# Patient Record
Sex: Female | Born: 1965
Health system: Southern US, Community
[De-identification: ages and names within clinical notes are randomized; demographics above are authoritative.]

## PROBLEM LIST (undated history)

## (undated) DIAGNOSIS — E079 Disorder of thyroid, unspecified: Secondary | ICD-10-CM

## (undated) DIAGNOSIS — K635 Polyp of colon: Secondary | ICD-10-CM

## (undated) DIAGNOSIS — G43909 Migraine, unspecified, not intractable, without status migrainosus: Secondary | ICD-10-CM

## (undated) DIAGNOSIS — K589 Irritable bowel syndrome without diarrhea: Secondary | ICD-10-CM

## (undated) DIAGNOSIS — E669 Obesity, unspecified: Secondary | ICD-10-CM

## (undated) DIAGNOSIS — I493 Ventricular premature depolarization: Secondary | ICD-10-CM

## (undated) DIAGNOSIS — F32A Depression, unspecified: Secondary | ICD-10-CM

## (undated) DIAGNOSIS — G4730 Sleep apnea, unspecified: Secondary | ICD-10-CM

## (undated) DIAGNOSIS — F419 Anxiety disorder, unspecified: Secondary | ICD-10-CM

## (undated) DIAGNOSIS — K219 Gastro-esophageal reflux disease without esophagitis: Secondary | ICD-10-CM

## (undated) DIAGNOSIS — K5790 Diverticulosis of intestine, part unspecified, without perforation or abscess without bleeding: Secondary | ICD-10-CM

## (undated) DIAGNOSIS — F329 Major depressive disorder, single episode, unspecified: Secondary | ICD-10-CM

## (undated) HISTORY — DX: Diverticulosis of intestine, part unspecified, without perforation or abscess without bleeding: K57.90

## (undated) HISTORY — PX: ESOPHAGOGASTRODUODENOSCOPY: SHX1529

## (undated) HISTORY — DX: Polyp of colon: K63.5

## (undated) HISTORY — DX: Ventricular premature depolarization: I49.3

## (undated) HISTORY — DX: Migraine, unspecified, not intractable, without status migrainosus: G43.909

## (undated) HISTORY — DX: Anxiety disorder, unspecified: F41.9

## (undated) HISTORY — DX: Depression, unspecified: F32.A

## (undated) HISTORY — PX: COLONOSCOPY: SHX174

## (undated) HISTORY — DX: Disorder of thyroid, unspecified: E07.9

## (undated) HISTORY — DX: Gastro-esophageal reflux disease without esophagitis: K21.9

## (undated) HISTORY — DX: Irritable bowel syndrome, unspecified: K58.9

## (undated) HISTORY — DX: Obesity, unspecified: E66.9

## (undated) HISTORY — DX: Sleep apnea, unspecified: G47.30

---
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
## 1996-07-20 DIAGNOSIS — A879 Viral meningitis, unspecified: Secondary | ICD-10-CM

## 1996-07-20 HISTORY — DX: Viral meningitis, unspecified: A87.9

## 1996-07-20 HISTORY — PX: APPENDECTOMY: SHX54

## 1998-07-20 HISTORY — PX: CHOLECYSTECTOMY: SHX55

## 2006-07-20 DIAGNOSIS — A498 Other bacterial infections of unspecified site: Secondary | ICD-10-CM

## 2006-07-20 HISTORY — DX: Other bacterial infections of unspecified site: A49.8

## 2019-03-03 DIAGNOSIS — H5213 Myopia, bilateral: Secondary | ICD-10-CM | POA: Diagnosis not present

## 2019-06-13 DIAGNOSIS — K5900 Constipation, unspecified: Secondary | ICD-10-CM | POA: Diagnosis not present

## 2019-06-13 DIAGNOSIS — G4733 Obstructive sleep apnea (adult) (pediatric): Secondary | ICD-10-CM | POA: Diagnosis not present

## 2019-06-13 DIAGNOSIS — K219 Gastro-esophageal reflux disease without esophagitis: Secondary | ICD-10-CM | POA: Diagnosis not present

## 2019-06-13 DIAGNOSIS — E038 Other specified hypothyroidism: Secondary | ICD-10-CM | POA: Diagnosis not present

## 2019-06-13 DIAGNOSIS — F419 Anxiety disorder, unspecified: Secondary | ICD-10-CM | POA: Diagnosis not present

## 2019-06-13 DIAGNOSIS — R140 Abdominal distension (gaseous): Secondary | ICD-10-CM | POA: Diagnosis not present

## 2019-06-13 DIAGNOSIS — R1310 Dysphagia, unspecified: Secondary | ICD-10-CM | POA: Diagnosis not present

## 2019-06-13 DIAGNOSIS — E559 Vitamin D deficiency, unspecified: Secondary | ICD-10-CM | POA: Diagnosis not present

## 2019-06-13 DIAGNOSIS — R002 Palpitations: Secondary | ICD-10-CM | POA: Diagnosis not present

## 2019-06-14 DIAGNOSIS — R140 Abdominal distension (gaseous): Secondary | ICD-10-CM | POA: Diagnosis not present

## 2019-06-14 DIAGNOSIS — R9431 Abnormal electrocardiogram [ECG] [EKG]: Secondary | ICD-10-CM | POA: Diagnosis not present

## 2019-06-14 DIAGNOSIS — E559 Vitamin D deficiency, unspecified: Secondary | ICD-10-CM | POA: Diagnosis not present

## 2019-06-14 DIAGNOSIS — E038 Other specified hypothyroidism: Secondary | ICD-10-CM | POA: Diagnosis not present

## 2019-07-12 DIAGNOSIS — R1310 Dysphagia, unspecified: Secondary | ICD-10-CM | POA: Diagnosis not present

## 2019-07-12 DIAGNOSIS — Z8619 Personal history of other infectious and parasitic diseases: Secondary | ICD-10-CM | POA: Diagnosis not present

## 2019-07-12 DIAGNOSIS — R140 Abdominal distension (gaseous): Secondary | ICD-10-CM | POA: Diagnosis not present

## 2019-07-12 DIAGNOSIS — K219 Gastro-esophageal reflux disease without esophagitis: Secondary | ICD-10-CM | POA: Diagnosis not present

## 2019-07-12 DIAGNOSIS — K5900 Constipation, unspecified: Secondary | ICD-10-CM | POA: Diagnosis not present

## 2019-07-12 DIAGNOSIS — E669 Obesity, unspecified: Secondary | ICD-10-CM | POA: Diagnosis not present

## 2019-07-17 ENCOUNTER — Encounter: Payer: Self-pay | Admitting: Gastroenterology

## 2019-08-10 DIAGNOSIS — R42 Dizziness and giddiness: Secondary | ICD-10-CM | POA: Diagnosis not present

## 2019-08-10 DIAGNOSIS — E039 Hypothyroidism, unspecified: Secondary | ICD-10-CM | POA: Diagnosis not present

## 2019-08-10 DIAGNOSIS — F419 Anxiety disorder, unspecified: Secondary | ICD-10-CM | POA: Diagnosis not present

## 2019-08-10 DIAGNOSIS — R002 Palpitations: Secondary | ICD-10-CM | POA: Diagnosis not present

## 2019-08-10 DIAGNOSIS — Z8616 Personal history of COVID-19: Secondary | ICD-10-CM | POA: Diagnosis not present

## 2019-08-11 DIAGNOSIS — F419 Anxiety disorder, unspecified: Secondary | ICD-10-CM | POA: Diagnosis not present

## 2019-08-11 DIAGNOSIS — R42 Dizziness and giddiness: Secondary | ICD-10-CM | POA: Diagnosis not present

## 2019-08-11 DIAGNOSIS — E038 Other specified hypothyroidism: Secondary | ICD-10-CM | POA: Diagnosis not present

## 2019-08-11 DIAGNOSIS — H6593 Unspecified nonsuppurative otitis media, bilateral: Secondary | ICD-10-CM | POA: Diagnosis not present

## 2019-08-11 DIAGNOSIS — R002 Palpitations: Secondary | ICD-10-CM | POA: Diagnosis not present

## 2019-08-14 DIAGNOSIS — R7989 Other specified abnormal findings of blood chemistry: Secondary | ICD-10-CM | POA: Diagnosis not present

## 2019-08-18 ENCOUNTER — Ambulatory Visit (INDEPENDENT_AMBULATORY_CARE_PROVIDER_SITE_OTHER): Payer: 59 | Admitting: Gastroenterology

## 2019-08-18 ENCOUNTER — Encounter: Payer: Self-pay | Admitting: Gastroenterology

## 2019-08-18 VITALS — BP 120/64 | HR 89 | Temp 98.6°F | Ht 63.0 in | Wt 192.0 lb

## 2019-08-18 DIAGNOSIS — R1310 Dysphagia, unspecified: Secondary | ICD-10-CM | POA: Diagnosis not present

## 2019-08-18 DIAGNOSIS — K581 Irritable bowel syndrome with constipation: Secondary | ICD-10-CM

## 2019-08-18 MED ORDER — LINACLOTIDE 72 MCG PO CAPS: 72.0000 ug | ORAL_CAPSULE | Freq: Every day | ORAL | 11 refills | Status: DC

## 2019-08-18 NOTE — Progress Notes (Signed)
HPI: This is a very pleasant 54 year old woman who was referred by Dr. Kirby Crigler  Chief complaint is constipation dominant IBS, dysphagia, personal history of colon polyps  ID-first time today.  She has a long history of irritable bowel like issues.  She is tends to be chronically constipated.  She knows that stress plays a big role in her symptoms.  She tries to keep up on fiber in her diet, exercise and water.  She says Sheryle Spray sometimes helps as well.  She will periodically take MiraLAX.  She has never tried Linzess or Amitiza.  She does not see blood in her stool.  She usually will have a bowel movement about every day.  She has chronic intermittent solid food dysphagia.  This is no worse than it has been for years.  Breads and potatoes sometimes cause problems.  Also rice can make her cough.  Her weight is overall stable.  She does not have chronic heartburn   Old Data Reviewed:  She brought with her a 23 page packet of information dating back at least 10 years from her care at a [place] gastro[facility] office.  Gastric emptying scan December 2011 was normal   Pathology results from colonoscopy and upper endoscopy October 2018: Polyps from her colon were all hyperplastic.  GE junction showed GERD damage.  No eosinophilic esophagitis.  The duodenum was normal.  The stomach showed mild chronic gastritis without H. Pylori.  EGD report October 2018 indications GERD and dysphagia.  Findings "benign intrinsic 13 mm stricture was seen at the GE junction" it was dilated to 15 mm with a balloon.  Esophagitis was seen as well.  Biopsies were taken from a normal esophagus.  Colonoscopy report October 2018 described small 4 mm polyp in the sigmoid colon, diverticulosis in the left colon.  The examination was otherwise normal.  Blood work December 2010 celiac sprue panel was all negative including TTG and total IgA  EGD report November 2011 done for dysphagia and GERD described normal duodenum,  hiatal hernia, erythema in the lower esophagus.  Otherwise normal examination.  Biopsies were taken from her stomach I believe.  Sits marker testing November 2011 showed "no remaining sits markers"  CT scan chest abdomen pelvis with IV contrast done for "dysphagia and right upper quadrant pain" showed status post cholecystectomy, status post appendectomy, the examination was otherwise normal  Blood work November 2020 showed completely normal complete metabolic profile, normal CBC normal hemoglobin A1c  Esophageal manometry report November 2011 was "normal"  Ambulatory pH monitoring test November 2011 showed "poor gastric acid suppression" also "positive symptom association with regurgitation of throat pain" this was done while on medicines at [facility] 40 mg once daily   Stool Hemoccult testing December 2011 was negative  There is an office note from believe her gastroenterologist dated April 2018 in this they alluded to a "recent upper GI report where a Schatzki's ring in her distal esophagus was described"      Review of systems: Pertinent positive and negative review of systems were noted in the above HPI section. All other review negative.   Past Medical History:  Diagnosis Date  . Anxiety   . Clostridium difficile infection 2008  . Colon polyps   . Depression   . Diverticulosis   . GERD (gastroesophageal reflux disease)   . Irritable bowel syndrome (IBS)   . Migraines   . Obesity   . PVC (premature ventricular contraction)    when stressed  . Sleep apnea  mild   . Thyroid disease    2010 nodules only    Past Surgical History:  Procedure Laterality Date  . APPENDECTOMY  1998  . CHOLECYSTECTOMY  2000  . COLONOSCOPY    . ESOPHAGOGASTRODUODENOSCOPY      Current Outpatient Medications  Medication Sig Dispense Refill  . cetirizine (ZYRTEC) 10 MG tablet Take 10 mg by mouth daily.    . Cholecalciferol (VITAMIN D3) 125 MCG (5000 UT) CAPS Take 1 capsule by mouth daily.     . famotidine (PEPCID) 20 MG tablet Take 20 mg by mouth 2 (two) times daily.    . fluticasone (FLONASE) 50 MCG/ACT nasal spray Place 1 spray into both nostrils 2 (two) times daily.    Marland Kitchen levothyroxine (SYNTHROID) 75 MCG tablet Take 75 mcg by mouth daily before breakfast.    . Probiotic Product (PROBIOTIC PO) Take by mouth. It is in her fiber pill     No current facility-administered medications for this visit.    Allergies as of 08/18/2019 - Review Complete 08/18/2019  Allergen Reaction Noted  . Ansaid [flurbiprofen]  08/18/2019  . Celebrex [celecoxib] Other (See Comments) 08/18/2019  . Ibuprofen  08/18/2019    Family History  Problem Relation Age of Onset  . Lung cancer Mother   . Colonic polyp Mother   . Lung cancer Father   . Breast cancer Sister   . Colon cancer Neg Hx   . Esophageal cancer Neg Hx   . Rectal cancer Neg Hx     Social History   Socioeconomic History  . Marital status: Divorced    Spouse name: Not on file  . Number of children: 0  . Years of education: Not on file  . Highest education level: Not on file  Occupational History  . Occupation: Research scientist (medical)  Tobacco Use  . Smoking status: Never Smoker  . Smokeless tobacco: Never Used  Substance and Sexual Activity  . Alcohol use: Yes    Comment: rare  . Drug use: Never  . Sexual activity: Not on file  Other Topics Concern  . Not on file  Social History Narrative  . Not on file   Social Determinants of Health   Financial Resource Strain:   . Difficulty of Paying Living Expenses: Not on file  Food Insecurity:   . Worried About Charity fundraiser in the Last Year: Not on file  . Ran Out of Food in the Last Year: Not on file  Transportation Needs:   . Lack of Transportation (Medical): Not on file  . Lack of Transportation (Non-Medical): Not on file  Physical Activity:   . Days of Exercise per Week: Not on file  . Minutes of Exercise per Session: Not on file  Stress:   . Feeling of Stress :  Not on file  Social Connections:   . Frequency of Communication with Friends and Family: Not on file  . Frequency of Social Gatherings with Friends and Family: Not on file  . Attends Religious Services: Not on file  . Active Member of Clubs or Organizations: Not on file  . Attends Archivist Meetings: Not on file  . Marital Status: Not on file  Intimate Partner Violence:   . Fear of Current or Ex-Partner: Not on file  . Emotionally Abused: Not on file  . Physically Abused: Not on file  . Sexually Abused: Not on file     Physical Exam: BP 120/64   Pulse 89   Temp  98.6 F (37 C)   Ht 5\' 3"  (1.6 m)   Wt 192 lb (87.1 kg)   BMI 34.01 kg/m  Constitutional: generally well-appearing Psychiatric: alert and oriented x3 Eyes: extraocular movements intact Mouth: oral pharynx moist, no lesions Neck: supple no lymphadenopathy Cardiovascular: heart regular rate and rhythm Lungs: clear to auscultation bilaterally Abdomen: soft, nontender, nondistended, no obvious ascites, no peritoneal signs, normal bowel sounds Extremities: no lower extremity edema bilaterally Skin: no lesions on visible extremities   Assessment and plan: 54 y.o. female with IBS, constipation predominance, personal history of polyps, chronic dysphagia  First we discussed other options for her chronic IBS.  She would like to try Linzess and I am happy to call in a prescription for the low-dose 72 mcg pill which she will try 1 pill once daily.  Her mild intermittent chronic dysphagia is no worse lately than it has been for years.  She has not really very bothered by it.  She knows to call if it has significantly worsened.  She will continue chewing her food slowly, taking small bites and eating slowly.  The polyps removed from her 2018 colonoscopy were not precancerous.  She understands that she does not need repeat colonoscopy for 10 years from then I will put her in our recall system for her.    Please see the  "Patient Instructions" section for addition details about the plan.   Owens Loffler, MD [facility] 08/18/2019, 10:14 AM  Cc: No ref. provider found  Total time on date of encounter was 45  minutes (this included time spent preparing to see the patient reviewing records; obtaining and/or reviewing separately obtained history; performing a medically appropriate exam and/or evaluation; counseling and educating the patient and family if present; ordering medications, tests or procedures if applicable; and documenting clinical information in the health record).

## 2019-08-18 NOTE — Patient Instructions (Addendum)
You will be due for a recall colonoscopy in 04/2027. We will send you a reminder in the mail when it gets closer to that time.  We have sent the following medications to your pharmacy for you to pick up at your convenience: Linzess 72 mcg daily   Call if you have worsening trouble with swallowing. Ask for Koren Shiver, RN, BSN.

## 2019-09-04 DIAGNOSIS — L821 Other seborrheic keratosis: Secondary | ICD-10-CM | POA: Diagnosis not present

## 2019-09-04 DIAGNOSIS — D485 Neoplasm of uncertain behavior of skin: Secondary | ICD-10-CM | POA: Diagnosis not present

## 2019-09-04 DIAGNOSIS — L218 Other seborrheic dermatitis: Secondary | ICD-10-CM | POA: Diagnosis not present

## 2019-09-04 DIAGNOSIS — L814 Other melanin hyperpigmentation: Secondary | ICD-10-CM | POA: Diagnosis not present

## 2019-11-09 DIAGNOSIS — K5900 Constipation, unspecified: Secondary | ICD-10-CM | POA: Diagnosis not present

## 2019-11-09 DIAGNOSIS — R002 Palpitations: Secondary | ICD-10-CM | POA: Diagnosis not present

## 2019-11-09 DIAGNOSIS — F419 Anxiety disorder, unspecified: Secondary | ICD-10-CM | POA: Diagnosis not present

## 2019-11-09 DIAGNOSIS — Z8616 Personal history of COVID-19: Secondary | ICD-10-CM | POA: Diagnosis not present

## 2020-01-10 ENCOUNTER — Other Ambulatory Visit (HOSPITAL_BASED_OUTPATIENT_CLINIC_OR_DEPARTMENT_OTHER): Payer: Self-pay | Admitting: Internal Medicine

## 2020-01-18 DIAGNOSIS — Z01419 Encounter for gynecological examination (general) (routine) without abnormal findings: Secondary | ICD-10-CM | POA: Diagnosis not present

## 2020-01-18 DIAGNOSIS — Z6833 Body mass index (BMI) 33.0-33.9, adult: Secondary | ICD-10-CM | POA: Diagnosis not present

## 2020-01-30 DIAGNOSIS — Z1231 Encounter for screening mammogram for malignant neoplasm of breast: Secondary | ICD-10-CM | POA: Diagnosis not present

## 2020-02-08 DIAGNOSIS — H15102 Unspecified episcleritis, left eye: Secondary | ICD-10-CM | POA: Diagnosis not present

## 2020-02-16 ENCOUNTER — Other Ambulatory Visit (HOSPITAL_COMMUNITY): Payer: Self-pay | Admitting: Obstetrics and Gynecology

## 2020-02-16 ENCOUNTER — Other Ambulatory Visit: Payer: Self-pay | Admitting: Obstetrics and Gynecology

## 2020-02-16 DIAGNOSIS — Z803 Family history of malignant neoplasm of breast: Secondary | ICD-10-CM

## 2020-02-27 ENCOUNTER — Ambulatory Visit (HOSPITAL_COMMUNITY)
Admission: RE | Admit: 2020-02-27 | Discharge: 2020-02-27 | Disposition: A | Discharge status: Home / Self Care | Payer: 59 | Source: Ambulatory Visit | Attending: Obstetrics and Gynecology | Admitting: Obstetrics and Gynecology

## 2020-02-27 ENCOUNTER — Other Ambulatory Visit: Payer: Self-pay

## 2020-02-27 DIAGNOSIS — Z803 Family history of malignant neoplasm of breast: Secondary | ICD-10-CM | POA: Diagnosis not present

## 2020-02-27 DIAGNOSIS — Z1239 Encounter for other screening for malignant neoplasm of breast: Secondary | ICD-10-CM | POA: Insufficient documentation

## 2020-02-27 DIAGNOSIS — N6489 Other specified disorders of breast: Secondary | ICD-10-CM | POA: Diagnosis not present

## 2020-02-27 MED ORDER — GADOBUTROL 1 MMOL/ML IV SOLN
8.0000 mL | Freq: Once | INTRAVENOUS | Status: AC | PRN
  Administered 2020-02-27: 8 mL via INTRAVENOUS

## 2020-03-18 DIAGNOSIS — H5213 Myopia, bilateral: Secondary | ICD-10-CM | POA: Diagnosis not present

## 2020-04-22 MED FILL — SERTRALINE HCL 50 MG TABLET: 50 | 30 days supply | Qty: 30 | Fill #0

## 2020-05-30 MED FILL — SERTRALINE HCL 50 MG TABLET: 50 | 30 days supply | Qty: 30 | Fill #1

## 2020-05-31 ENCOUNTER — Other Ambulatory Visit (HOSPITAL_BASED_OUTPATIENT_CLINIC_OR_DEPARTMENT_OTHER): Payer: Self-pay | Admitting: Internal Medicine

## 2020-05-31 MED FILL — LEVOTHYROXINE SODIUM 75 MCG: 75 | 90 days supply | Qty: 90 | Fill #0

## 2020-07-01 MED FILL — SERTRALINE HCL 50 MG TABLET: 50 | 30 days supply | Qty: 30 | Fill #2

## 2020-07-31 MED FILL — SERTRALINE HCL 50 MG TABLET: 50 | 30 days supply | Qty: 30 | Fill #3

## 2020-08-30 MED FILL — SERTRALINE HCL 50 MG TABLET: 50 | 30 days supply | Qty: 30 | Fill #4

## 2020-08-30 MED FILL — LEVOTHYROXINE SODIUM 75 MCG: 75 | 90 days supply | Qty: 90 | Fill #1

## 2020-09-03 DIAGNOSIS — L821 Other seborrheic keratosis: Secondary | ICD-10-CM | POA: Diagnosis not present

## 2020-09-03 DIAGNOSIS — I8392 Asymptomatic varicose veins of left lower extremity: Secondary | ICD-10-CM | POA: Diagnosis not present

## 2020-09-03 DIAGNOSIS — L814 Other melanin hyperpigmentation: Secondary | ICD-10-CM | POA: Diagnosis not present

## 2020-09-10 DIAGNOSIS — I83813 Varicose veins of bilateral lower extremities with pain: Secondary | ICD-10-CM | POA: Diagnosis not present

## 2020-09-24 DIAGNOSIS — I83813 Varicose veins of bilateral lower extremities with pain: Secondary | ICD-10-CM | POA: Diagnosis not present

## 2020-10-03 DIAGNOSIS — I83813 Varicose veins of bilateral lower extremities with pain: Secondary | ICD-10-CM | POA: Diagnosis not present

## 2020-10-28 ENCOUNTER — Other Ambulatory Visit (HOSPITAL_BASED_OUTPATIENT_CLINIC_OR_DEPARTMENT_OTHER): Payer: Self-pay

## 2020-10-28 ENCOUNTER — Other Ambulatory Visit (HOSPITAL_BASED_OUTPATIENT_CLINIC_OR_DEPARTMENT_OTHER): Payer: Self-pay | Admitting: Internal Medicine

## 2020-10-29 MED ORDER — SERTRALINE HCL 50 MG PO TABS
ORAL_TABLET | ORAL | 0 refills | Status: AC
  Filled 2020-10-29: qty 90, 90d supply, fill #0
  Filled 2021-02-06: qty 83, 83d supply, fill #1

## 2020-10-30 ENCOUNTER — Other Ambulatory Visit (HOSPITAL_BASED_OUTPATIENT_CLINIC_OR_DEPARTMENT_OTHER): Payer: Self-pay

## 2020-12-04 ENCOUNTER — Other Ambulatory Visit (HOSPITAL_BASED_OUTPATIENT_CLINIC_OR_DEPARTMENT_OTHER): Payer: Self-pay

## 2020-12-04 MED FILL — Levothyroxine Sodium Tab 75 MCG: ORAL | 90 days supply | Qty: 90 | Fill #0 | Status: AC

## 2020-12-13 DIAGNOSIS — H532 Diplopia: Secondary | ICD-10-CM | POA: Diagnosis not present

## 2020-12-17 DIAGNOSIS — E039 Hypothyroidism, unspecified: Secondary | ICD-10-CM | POA: Diagnosis not present

## 2020-12-17 DIAGNOSIS — G4733 Obstructive sleep apnea (adult) (pediatric): Secondary | ICD-10-CM | POA: Diagnosis not present

## 2020-12-17 DIAGNOSIS — E559 Vitamin D deficiency, unspecified: Secondary | ICD-10-CM | POA: Diagnosis not present

## 2020-12-17 DIAGNOSIS — H532 Diplopia: Secondary | ICD-10-CM | POA: Diagnosis not present

## 2020-12-18 ENCOUNTER — Other Ambulatory Visit: Payer: Self-pay

## 2020-12-18 ENCOUNTER — Ambulatory Visit (HOSPITAL_COMMUNITY)
Admission: RE | Admit: 2020-12-18 | Discharge: 2020-12-18 | Disposition: A | Discharge status: Home / Self Care | Payer: 59 | Source: Ambulatory Visit | Attending: Internal Medicine | Admitting: Internal Medicine

## 2020-12-18 ENCOUNTER — Other Ambulatory Visit (HOSPITAL_COMMUNITY): Payer: Self-pay | Admitting: Internal Medicine

## 2020-12-18 ENCOUNTER — Other Ambulatory Visit: Payer: Self-pay | Admitting: Internal Medicine

## 2020-12-18 DIAGNOSIS — H538 Other visual disturbances: Secondary | ICD-10-CM | POA: Diagnosis not present

## 2020-12-18 DIAGNOSIS — H532 Diplopia: Secondary | ICD-10-CM | POA: Diagnosis not present

## 2020-12-18 MED ORDER — GADOBUTROL 1 MMOL/ML IV SOLN
10.0000 mL | Freq: Once | INTRAVENOUS | Status: AC | PRN
  Administered 2020-12-18: 10 mL via INTRAVENOUS

## 2020-12-20 DIAGNOSIS — H532 Diplopia: Secondary | ICD-10-CM | POA: Diagnosis not present

## 2021-01-08 ENCOUNTER — Ambulatory Visit (INDEPENDENT_AMBULATORY_CARE_PROVIDER_SITE_OTHER): Payer: 59 | Admitting: Neurology

## 2021-01-08 ENCOUNTER — Encounter: Payer: Self-pay | Admitting: Neurology

## 2021-01-08 VITALS — BP 141/85 | HR 78 | Ht 63.0 in | Wt 204.0 lb

## 2021-01-08 DIAGNOSIS — H532 Diplopia: Secondary | ICD-10-CM | POA: Diagnosis not present

## 2021-01-08 NOTE — Progress Notes (Signed)
Reason for visit: Double vision  Referring physician: Dr. Ernesto Rutherford Brittany Padilla is a 55 y.o. female  History of present illness:  Brittany Padilla is a 55 year old right-handed white female with a history of migraine headache.  She also has a history of sleep apnea on CPAP and hypothyroidism as well as obesity.  The patient reported a spontaneous onset of intermittent double vision that began in late January 2022.  The patient noted the double vision while driving, she would note that the lines may converge on the road and she may see more lanes on the road than are actually present.  The patient has had some worsening of the double vision of the last 3 weeks or so, she went to her eye doctor and had prisms placed on her glasses which seem to help.  The patient reports that she may increase the double vision by looking to the far right or to the far left.  The patient reports some chronic issues with feeling as if things are getting stuck in her throat when swallowing, she denies problems chewing.  She denies any weakness of the extremities or numbness of the extremities.  She has slight gait instability but no falls.  She denies issues controlling the bowels or the bladder.  She does have a history of migraine headaches are associated with visual aura.  The patient will get a sensation that things are too bright and then see jagged lines and have scotoma in the vision lasting about 30 minutes and then she will get a headache that would last up to 3 days.  She may have other more minor headaches.  Her usual headaches are in the frontal area.  Eating eggs too much, processed meats, possibly cheese, bright lights, and possibly weather changes may bring on headache.  Due to the double vision issue, she is sent here for further evaluation.  She denies any history of strabismus, she has not had any droopiness of the eyelids on either side.  It is possible that the double vision may become more prominent when  she is tired, if she covers 1 eye of the double vision goes away.  Past Medical History:  Diagnosis Date   Anxiety    Clostridium difficile infection 2008   Colon polyps    Depression    Diverticulosis    GERD (gastroesophageal reflux disease)    Irritable bowel syndrome (IBS)    Migraines    Obesity    PVC (premature ventricular contraction)    when stressed   Sleep apnea    mild    Thyroid disease    2010 nodules only   Viral meningitis 1998    Past Surgical History:  Procedure Laterality Date   APPENDECTOMY  1998   CHOLECYSTECTOMY  2000   COLONOSCOPY     ESOPHAGOGASTRODUODENOSCOPY      Family History  Problem Relation Age of Onset   Lung cancer Mother    Colonic polyp Mother    Lung cancer Father    Breast cancer Sister    Colon cancer Neg Hx    Esophageal cancer Neg Hx    Rectal cancer Neg Hx     Social history:  reports that she has never smoked. She has never used smokeless tobacco. She reports current alcohol use. She reports that she does not use drugs.  Medications:  Prior to Admission medications   Medication Sig Start Date End Date Taking? Authorizing Provider  cetirizine (ZYRTEC) 10 MG  tablet Take 10 mg by mouth daily.    [provider]  Cholecalciferol (VITAMIN D3) 125 MCG (5000 UT) CAPS Take 1 capsule by mouth daily.    [provider]  famotidine (PEPCID) 20 MG tablet Take 20 mg by mouth 2 (two) times daily.    [provider]  fluticasone (FLONASE) 50 MCG/ACT nasal spray Place 1 spray into both nostrils 2 (two) times daily.    [provider]  levothyroxine (SYNTHROID) 75 MCG tablet Take 75 mcg by mouth daily before breakfast.    [provider]  levothyroxine (SYNTHROID) 75 MCG tablet TAKE 1 TABLET BY MOUTH EVERY MORNING ON AN EMPTY STOMACH 05/31/20 05/31/21  Sueanne Margarita, DO  linaclotide [facility]) 72 MCG capsule Take 1 capsule (72 mcg total) by mouth daily before breakfast. 08/18/19   Milus Banister, MD  Probiotic Product (PROBIOTIC PO) Take by mouth. It is in her fiber pill    [provider]  sertraline (ZOLOFT) 50 MG tablet TAKE 1 TABLET BY MOUTH ONCE DAILY IF NOT TOLERATED CAN TAKE 1/2 TABLET FOR 2 WEEKS 10/29/20 10/29/21  Sueanne Margarita, DO      Allergies  Allergen Reactions   Ansaid [Flurbiprofen]     Prefers not too   Celebrex [Celecoxib] Other (See Comments)    Red itchy hands and feet and roof of mouth itched   Ibuprofen     ROS:  Out of a complete 14 system review of symptoms, the patient complains only of the following symptoms, and all other reviewed systems are negative.  Double vision Headache  Blood pressure (!) 141/85, pulse 78, height 5\' 3"  (1.6 m), weight 204 lb (92.5 kg).  Physical Exam  General: The patient is alert and cooperative at the time of the examination.  The patient is moderately obese.  Eyes: Pupils are equal, round, and reactive to light. Discs are flat bilaterally.  Neck: The neck is supple, no carotid bruits are noted.  Respiratory: The respiratory examination is clear.  Cardiovascular: The cardiovascular examination reveals a regular rate and rhythm, no obvious murmurs or rubs are noted.  Skin: Extremities are without significant edema.  Neurologic Exam  Mental status: The patient is alert and oriented x 3 at the time of the examination. The patient has apparent normal recent and remote memory, with an apparently normal attention span and concentration ability.  Cranial nerves: Facial symmetry is present. There is good sensation of the face to pinprick and soft touch bilaterally. The strength of the facial muscles and the muscles to head turning and shoulder shrug are normal bilaterally. Speech is well enunciated, no aphasia or dysarthria is noted. Extraocular movements are full. Visual fields are full. The tongue is midline, and the patient has symmetric elevation of the soft palate. No obvious hearing deficits are noted.   Cover test is positive in the horizontal plane.  Motor: The motor testing reveals 5 over 5 strength of all 4 extremities. Good symmetric motor tone is noted throughout.  Sensory: Sensory testing is intact to pinprick, soft touch, vibration sensation, and position sense on all 4 extremities. No evidence of extinction is noted.  Coordination: Cerebellar testing reveals good finger-nose-finger and heel-to-shin bilaterally.  Gait and station: Gait is normal. Tandem gait is normal. Romberg is negative. No drift is seen.  Reflexes: Deep tendon reflexes are symmetric and normal bilaterally. Toes are downgoing bilaterally.   MRI brain 12/18/20:  IMPRESSION: 1. No acute intracranial abnormality. 2. Multifocal white matter hyperintensities, most consistent  with chronic microvascular ischemia.   * MRI scan images were reviewed online. I agree with the written report.   MRA head 12/18/20:  IMPRESSION: Normal intracranial MRA.    Assessment/Plan:  1.  Intermittent double vision, horizontal  The patient has had some intermittent double vision over the last 6 months or so.  The problem has worsened slightly over the last 3 weeks, but she has gotten benefit from a prism that she is wearing.  We will perform a bit further work-up with blood work, we may consider an empiric trial on Mestinon.  The patient very well could have a latent strabismus as a source of her double vision.  MRI done does show some mild white matter changes, the patient brought in MRI of the brain that was done in 2011, there appears to be very little change on the most recent study.  She will follow-up in 3 to 4 months.  Jill Alexanders MD 01/08/2021 11:44 AM  [facility] [address] [place], [place] [address]  Phone [phone] Fax [phone]

## 2021-01-09 LAB — LYME DISEASE SEROLOGY W/REFLEX: Lyme Total Antibody EIA: NEGATIVE

## 2021-01-09 LAB — THYROGLOBULIN ANTIBODY: Thyroglobulin Antibody: <1 IU/mL (ref 0.0–0.9)

## 2021-01-09 LAB — SEDIMENTATION RATE: Sed Rate: 24 mm/hr (ref 0–40)

## 2021-01-09 LAB — HEMOGLOBIN A1C
Est. average glucose Bld gHb Est-mCnc: 114 mg/dL
Hgb A1c MFr Bld: 5.6 % (ref 4.8–5.6)

## 2021-01-09 LAB — ANA W/REFLEX: Anti Nuclear Antibody (ANA): NEGATIVE

## 2021-01-09 LAB — THYROID PEROXIDASE ANTIBODY: Thyroperoxidase Ab SerPl-aCnc: <8 IU/mL (ref 0–34)

## 2021-01-09 LAB — VITAMIN B12: Vitamin B-12: 655 pg/mL (ref 232–1245)

## 2021-01-09 LAB — ANGIOTENSIN CONVERTING ENZYME: Angio Convert Enzyme: 53 U/L (ref 14–82)

## 2021-01-09 LAB — ACETYLCHOLINE RECEPTOR, BINDING: AChR Binding Ab, Serum: 0.04 nmol/L (ref 0.00–0.24)

## 2021-01-10 ENCOUNTER — Telehealth: Payer: Self-pay | Admitting: Neurology

## 2021-01-10 DIAGNOSIS — H532 Diplopia: Secondary | ICD-10-CM | POA: Diagnosis not present

## 2021-01-10 MED ORDER — PYRIDOSTIGMINE BROMIDE 60 MG PO TABS: 30.0000 mg | ORAL_TABLET | Freq: Three times a day (TID) | ORAL | 2 refills | Status: DC

## 2021-01-10 NOTE — Addendum Note (Signed)
Addended by: Kathrynn Ducking on: 01/10/2021 11:42 AM   Modules accepted: Orders

## 2021-01-10 NOTE — Telephone Encounter (Signed)
I called the patient.  We will give a trial on Mestinon, if this does benefit her vision, she is to contact our office.

## 2021-01-10 NOTE — Telephone Encounter (Signed)
Pt is asking about when she will get the trial Mestinon, please call.

## 2021-02-03 DIAGNOSIS — Z01419 Encounter for gynecological examination (general) (routine) without abnormal findings: Secondary | ICD-10-CM | POA: Diagnosis not present

## 2021-02-03 DIAGNOSIS — Z6835 Body mass index (BMI) 35.0-35.9, adult: Secondary | ICD-10-CM | POA: Diagnosis not present

## 2021-02-03 DIAGNOSIS — Z1231 Encounter for screening mammogram for malignant neoplasm of breast: Secondary | ICD-10-CM | POA: Diagnosis not present

## 2021-02-06 ENCOUNTER — Other Ambulatory Visit (HOSPITAL_BASED_OUTPATIENT_CLINIC_OR_DEPARTMENT_OTHER): Payer: Self-pay

## 2021-02-14 DIAGNOSIS — Z6835 Body mass index (BMI) 35.0-35.9, adult: Secondary | ICD-10-CM | POA: Diagnosis not present

## 2021-02-14 DIAGNOSIS — H532 Diplopia: Secondary | ICD-10-CM | POA: Diagnosis not present

## 2021-02-14 DIAGNOSIS — Z1339 Encounter for screening examination for other mental health and behavioral disorders: Secondary | ICD-10-CM | POA: Diagnosis not present

## 2021-02-14 DIAGNOSIS — Z1331 Encounter for screening for depression: Secondary | ICD-10-CM | POA: Diagnosis not present

## 2021-02-14 DIAGNOSIS — E559 Vitamin D deficiency, unspecified: Secondary | ICD-10-CM | POA: Diagnosis not present

## 2021-02-14 DIAGNOSIS — F325 Major depressive disorder, single episode, in full remission: Secondary | ICD-10-CM | POA: Diagnosis not present

## 2021-02-14 DIAGNOSIS — E039 Hypothyroidism, unspecified: Secondary | ICD-10-CM | POA: Diagnosis not present

## 2021-02-14 DIAGNOSIS — Z23 Encounter for immunization: Secondary | ICD-10-CM | POA: Diagnosis not present

## 2021-02-14 DIAGNOSIS — Z0000 Encounter for general adult medical examination without abnormal findings: Secondary | ICD-10-CM | POA: Diagnosis not present

## 2021-02-14 DIAGNOSIS — J351 Hypertrophy of tonsils: Secondary | ICD-10-CM | POA: Diagnosis not present

## 2021-02-14 DIAGNOSIS — G4733 Obstructive sleep apnea (adult) (pediatric): Secondary | ICD-10-CM | POA: Diagnosis not present

## 2021-02-17 ENCOUNTER — Encounter: Payer: Self-pay | Admitting: Neurology

## 2021-02-17 ENCOUNTER — Ambulatory Visit (INDEPENDENT_AMBULATORY_CARE_PROVIDER_SITE_OTHER): Payer: 59 | Admitting: Neurology

## 2021-02-17 ENCOUNTER — Telehealth: Payer: Self-pay | Admitting: *Deleted

## 2021-02-17 VITALS — BP 130/84 | HR 67 | Ht 63.0 in | Wt 205.0 lb

## 2021-02-17 DIAGNOSIS — R82998 Other abnormal findings in urine: Secondary | ICD-10-CM | POA: Diagnosis not present

## 2021-02-17 DIAGNOSIS — G4733 Obstructive sleep apnea (adult) (pediatric): Secondary | ICD-10-CM

## 2021-02-17 DIAGNOSIS — Z1212 Encounter for screening for malignant neoplasm of rectum: Secondary | ICD-10-CM | POA: Diagnosis not present

## 2021-02-17 DIAGNOSIS — Z9989 Dependence on other enabling machines and devices: Secondary | ICD-10-CM | POA: Diagnosis not present

## 2021-02-17 NOTE — Progress Notes (Signed)
Subjective:    Patient ID: Brittany Padilla is a 55 y.o. female.  HPI    Star Age, MD, PhD [facility] [address] [place], [place] [address] Dear Dr. Francesco Sor,   I saw your patient, Brittany Padilla, upon your kind request in my sleep clinic today for initial consultation of her sleep disorder, in particular, evaluation of her sleep apnea.  The patient is unaccompanied today.  As you know, Ms. Pollnow is a 55 year old right-handed woman with an underlying medical history of allergies, reflux disease, hypothyroidism, migraines, diplopia (recently seen by my colleague, Dr. Jannifer Franklin), irritable bowel syndrome, history of diverticulitis, and obesity, who was previously diagnosed with obstructive sleep apnea and was placed on PAP therapy.  Sleep testing was in March 2019.  I was able to review her home sleep test report from 09/30/2017.  Study was done in [place] at the time.  Her overall AHI was 8/h, O2 nadir 87.7%.  She was started on AutoPap therapy.  She has a Social worker.  She has registered her machine back in June as she was notified of the recall on the [employer].  I was able to review a compliance report as well.  She recently had some weight increase.  She is working on lifestyle modification, trying to exercise more.  She has been trying to get back on her CPAP machine.  It looks like she has been on an AutoPap machine, 90th percentile of pressure at 5.8 cm, average AHI 2.6, compliance data was reviewed from 01/18/2021 through 02/16/2021.  Average usage was 2 hours and 49 minutes, leak on the higher side. Her biggest issue is trying to stay asleep.  She has tried over-the-counter medication including Benadryl, melatonin about 5 mg, and other supplements, natural remedies.  Melatonin gave her headache.  She has had good success in reducing her double vision with prism eyeglasses.  She goes to bed between 930 and 10 and rise time is  generally between 5:15 AM and 5:45 AM.  She has woken up occasionally with a headache and has nocturia about once or twice per average night.  She is divorced and lives alone, 1 cat and 2 dogs in the household.  Sometimes her pets wake her up in the middle of the night, most of the time it is no apparent reason.  It is hard for her to maintain treatment with her AutoPap machine once she is up in the middle of the night.  She limits her caffeine to 2 cups of coffee in the morning, drinks alcohol rarely, is a non-smoker.  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  She does not have a local DME company yet.  She has been using nasal pillows and has extra supplies.  Of note, she has been on sertraline, has been on an antidepressant off and on for years.  She is currently taking the sertraline in the mornings.  Her Past Medical History Is Significant For: Past Medical History:  Diagnosis Date   Anxiety    Clostridium difficile infection 2008   Colon polyps    Depression    Diverticulosis    GERD (gastroesophageal reflux disease)    Irritable bowel syndrome (IBS)    Migraines    Obesity    PVC (premature ventricular contraction)    when stressed   Sleep apnea    mild    Thyroid disease    2010 nodules only   Viral  meningitis 1998    Her Past Surgical History Is Significant For: Past Surgical History:  Procedure Laterality Date   APPENDECTOMY  1998   CHOLECYSTECTOMY  2000   COLONOSCOPY     ESOPHAGOGASTRODUODENOSCOPY      Her Family History Is Significant For: Family History  Problem Relation Age of Onset   Lung cancer Mother    Colonic polyp Mother    Sleep apnea Mother    Lung cancer Father    Breast cancer Sister    Tremor Sister    Colon cancer Neg Hx    Esophageal cancer Neg Hx    Rectal cancer Neg Hx     Her Social History Is Significant For: Social History   Socioeconomic History   Marital status: Divorced    Spouse name: Not on file   Number of children: 0    Years of education: Not on file   Highest education level: Not on file  Occupational History   Occupation: Research scientist (medical)  Tobacco Use   Smoking status: Never   Smokeless tobacco: Never  Vaping Use   Vaping Use: Never used  Substance and Sexual Activity   Alcohol use: Yes    Comment: rare   Drug use: Never   Sexual activity: Not on file  Other Topics Concern   Not on file  Social History Narrative   Not on file   Social Determinants of Health   Financial Resource Strain: Not on file  Food Insecurity: Not on file  Transportation Needs: Not on file  Physical Activity: Not on file  Stress: Not on file  Social Connections: Not on file    Her Allergies Are:  Allergies  Allergen Reactions   Ansaid [Flurbiprofen]     Prefers not too   Celebrex [Celecoxib] Other (See Comments)    Red itchy hands and feet and roof of mouth itched   Ibuprofen   :   His Current Medications Are:  Outpatient Encounter Medications as of 02/17/2021  Medication Sig   cetirizine (ZYRTEC ALLERGY) 10 MG tablet one po qd.   famotidine (PEPCID) 20 MG tablet Take 20 mg by mouth 2 (two) times daily.   levothyroxine (SYNTHROID) 75 MCG tablet TAKE 1 TABLET BY MOUTH EVERY MORNING ON AN EMPTY STOMACH   Probiotic Product (PROBIOTIC PO) Take by mouth. It is in her fiber pill   sertraline (ZOLOFT) 50 MG tablet TAKE 1 TABLET BY MOUTH ONCE DAILY IF NOT TOLERATED CAN TAKE 1/2 TABLET FOR 2 WEEKS   VITAMIN D PO Take by mouth daily. 10000 units   [DISCONTINUED] fexofenadine (ALLEGRA) 60 MG tablet    [DISCONTINUED] fluticasone (FLONASE) 50 MCG/ACT nasal spray Place 1 spray into both nostrils 2 (two) times daily.   [DISCONTINUED] pyridostigmine (MESTINON) 60 MG tablet Take 0.5 tablets (30 mg total) by mouth 3 (three) times daily.   No facility-administered encounter medications on file as of 02/17/2021.  :   Review of Systems:  Out of a complete 14 point review of systems, all are reviewed and negative with the  exception of these symptoms as listed below:   Review of Systems  Neurological:        Pt here today for sleep consult, NP paper proficient referral Sueanne Margarita MD Pt has hx of OSA fatigued, needs new CPAP. per patient sleep study done 09/30/2017. Pt states she has not used cpap often, trying to start back.  Epworth Sleepiness Scale 0= would never doze 1= slight chance of dozing 2=  moderate chance of dozing 3= high chance of dozing  Sitting and reading:0 Watching TV:0 Sitting inactive in a public place (ex. Theater or meeting):0 As a passenger in a car for an hour without a break:1 Lying down to rest in the afternoon:3 Sitting and talking to someone:0 Sitting quietly after lunch (no alcohol):0 In a car, while stopped in traffic:0 Total: 4     Objective:  Neurological Exam  Physical Exam Physical Examination:   Vitals:   02/17/21 0905  BP: 130/84  Pulse: 67   General Examination: The patient is a very pleasant 55 y.o. female in no acute distress. She appears well-developed and well-nourished and well groomed.   HEENT: Normocephalic, atraumatic, pupils are equal, round and reactive to light, extraocular tracking is well-preserved, hearing grossly intact.  She denies any current diplopia and has preserved eyeglasses.  Airway examination reveals mild mouth dryness, tongue protrudes centrally and palate elevates symmetrically, mild airway crowding noted.  No carotid bruits.    Chest: Clear to auscultation without wheezing, rhonchi or crackles noted.  Heart: S1+S2+0, regular and normal without murmurs, rubs or gallops noted.   Abdomen: Soft, non-tender and non-distended.  Extremities: There is no pitting edema in the distal lower extremities bilaterally.   Skin: Warm and dry without trophic changes noted.   Musculoskeletal: exam reveals no obvious joint deformities, tenderness or joint swelling or erythema.   Neurologically:  Mental status: The patient is awake,  alert and oriented in all 4 spheres. Her immediate and remote memory, attention, language skills and fund of knowledge are appropriate. There is no evidence of aphasia, agnosia, apraxia or anomia. Speech is clear with normal prosody and enunciation. Thought process is linear. Mood is normal and affect is normal.  Cranial nerves II - XII are as described above under HEENT exam.  Motor exam: Normal bulk, strength and tone is noted. There is no tremor. Fine motor skills and coordination: grossly intact.  Cerebellar testing: No dysmetria or intention tremor. There is no truncal or gait ataxia.  Sensory exam: intact to light touch in the upper and lower extremities.  Gait, station and balance: She stands easily. No veering to one side is noted. No leaning to one side is noted. Posture is age-appropriate and stance is narrow based. Gait shows normal stride length and normal pace. No problems turning are noted.    Assessment and Plan:  In summary, VANNIDA CLUSTER is a very pleasant 55 y.o.-year old female with an underlying medical history of allergies, reflux disease, hypothyroidism, migraines, diplopia (recently seen by my colleague, Dr. Jannifer Franklin), irritable bowel syndrome, history of diverticulitis, and obesity, who presents for evaluation of her obstructive sleep apnea.  She was diagnosed with overall mild sleep apnea via home sleep testing in March 2019 when she was in [place].  She has been on AutoPap therapy.  She is generally compliant with treatment but is working on more consistent adherence to treatment.  She has been using her AutoPap fairly consistently in the past month.  She is encouraged to continue with treatment.  We will have her plugged in with a local DME company so she can get her supplies on a regular basis.  We talked about the importance of healthy lifestyle and pursuing weight loss.  She is motivated to continue with treatment.  For sleep maintenance difficulties she is advised to try  melatonin at a lower dose, 1 mg to 3 mg about 2 hours before bedtime.  In addition, she is encouraged to  try to switch her sertraline to bedtime to see if she sleeps just a little bit better that way.  She is advised to follow-up routinely in this clinic for sleep apnea management in about 6 months, she can see one of our nurse practitioners.  I answered all her questions today and she was in agreement with the plan.   Thank you very much for allowing me to participate in the care of this nice patient. If I can be of any further assistance to you please do not hesitate to call me at [phone].  Sincerely,   Star Age, MD, PhD

## 2021-02-17 NOTE — Patient Instructions (Signed)
It was nice to meet you today.  I believe your pressure setting on the machine is adequate.  Your sleep apnea is under good control when you are able to use your machine consistently.  I will make a referral for you to get your supplies from a local DME (durable medical equipment) company.   You can try Melatonin at night for sleep: take 1 mg to 3 mg, one to 2 hours before your bedtime.   Please continue using your autoPAP regularly. While your insurance requires that you use PAP at least 4 hours each night on 70% of the nights, I recommend, that you not skip any nights and use it throughout the night if you can. Getting used to PAP and staying with the treatment long term does take time and patience and discipline. Untreated obstructive sleep apnea when it is moderate to severe can have an adverse impact on cardiovascular health and raise her risk for heart disease, arrhythmias, hypertension, congestive heart failure, stroke and diabetes. Untreated obstructive sleep apnea causes sleep disruption, nonrestorative sleep, and sleep deprivation. This can have an impact on your day to day functioning and cause daytime sleepiness and impairment of cognitive function, memory loss, mood disturbance, and problems focussing. Using PAP regularly can improve these symptoms.  Please continue to work on your weight loss.  Try to take your sertraline at bedtime rather than in the morning as some people have sleepiness from it.

## 2021-02-17 NOTE — Telephone Encounter (Signed)
I have sent a secure message to [employer] regarding order for CPAP supplies.

## 2021-02-20 NOTE — Telephone Encounter (Signed)
I received a message from Vernon at [employer].  She needs a copy of the office note and the HST.  Both of these have been faxed to her attention. Received a receipt of confirmation.

## 2021-03-07 ENCOUNTER — Other Ambulatory Visit (HOSPITAL_BASED_OUTPATIENT_CLINIC_OR_DEPARTMENT_OTHER): Payer: Self-pay

## 2021-03-07 MED FILL — Levothyroxine Sodium Tab 75 MCG: ORAL | 90 days supply | Qty: 90 | Fill #1 | Status: AC

## 2021-03-17 DIAGNOSIS — J342 Deviated nasal septum: Secondary | ICD-10-CM | POA: Diagnosis not present

## 2021-03-17 DIAGNOSIS — J3489 Other specified disorders of nose and nasal sinuses: Secondary | ICD-10-CM | POA: Diagnosis not present

## 2021-03-17 DIAGNOSIS — K219 Gastro-esophageal reflux disease without esophagitis: Secondary | ICD-10-CM | POA: Diagnosis not present

## 2021-03-17 DIAGNOSIS — J358 Other chronic diseases of tonsils and adenoids: Secondary | ICD-10-CM | POA: Diagnosis not present

## 2021-03-17 DIAGNOSIS — J343 Hypertrophy of nasal turbinates: Secondary | ICD-10-CM | POA: Diagnosis not present

## 2021-03-19 DIAGNOSIS — H5213 Myopia, bilateral: Secondary | ICD-10-CM | POA: Diagnosis not present

## 2021-03-19 DIAGNOSIS — H524 Presbyopia: Secondary | ICD-10-CM | POA: Diagnosis not present

## 2021-04-07 ENCOUNTER — Other Ambulatory Visit (HOSPITAL_BASED_OUTPATIENT_CLINIC_OR_DEPARTMENT_OTHER): Payer: Self-pay

## 2021-04-07 MED ORDER — SUCRALFATE 1 G PO TABS
ORAL_TABLET | ORAL | 0 refills | Status: AC
  Filled 2021-04-07: qty 120, 30d supply, fill #0

## 2021-04-28 DIAGNOSIS — J358 Other chronic diseases of tonsils and adenoids: Secondary | ICD-10-CM | POA: Diagnosis not present

## 2021-04-28 DIAGNOSIS — K219 Gastro-esophageal reflux disease without esophagitis: Secondary | ICD-10-CM | POA: Diagnosis not present

## 2021-05-02 DIAGNOSIS — J358 Other chronic diseases of tonsils and adenoids: Secondary | ICD-10-CM | POA: Diagnosis not present

## 2021-05-02 DIAGNOSIS — J351 Hypertrophy of tonsils: Secondary | ICD-10-CM | POA: Diagnosis not present

## 2021-05-05 ENCOUNTER — Other Ambulatory Visit (HOSPITAL_BASED_OUTPATIENT_CLINIC_OR_DEPARTMENT_OTHER): Payer: Self-pay | Admitting: Internal Medicine

## 2021-05-08 ENCOUNTER — Other Ambulatory Visit (HOSPITAL_BASED_OUTPATIENT_CLINIC_OR_DEPARTMENT_OTHER): Payer: Self-pay

## 2021-05-08 MED ORDER — SERTRALINE HCL 50 MG PO TABS
50.0000 mg | ORAL_TABLET | Freq: Every day | ORAL | 0 refills | Status: DC
  Filled 2021-05-08: qty 90, 90d supply, fill #0

## 2021-05-12 ENCOUNTER — Encounter: Payer: Self-pay | Admitting: Neurology

## 2021-05-12 ENCOUNTER — Other Ambulatory Visit (HOSPITAL_BASED_OUTPATIENT_CLINIC_OR_DEPARTMENT_OTHER): Payer: Self-pay

## 2021-05-12 ENCOUNTER — Ambulatory Visit (INDEPENDENT_AMBULATORY_CARE_PROVIDER_SITE_OTHER): Payer: 59 | Admitting: Neurology

## 2021-05-12 VITALS — BP 128/83 | HR 69 | Ht 63.0 in | Wt 203.0 lb

## 2021-05-12 DIAGNOSIS — G4733 Obstructive sleep apnea (adult) (pediatric): Secondary | ICD-10-CM | POA: Insufficient documentation

## 2021-05-12 DIAGNOSIS — Z9989 Dependence on other enabling machines and devices: Secondary | ICD-10-CM

## 2021-05-12 DIAGNOSIS — H532 Diplopia: Secondary | ICD-10-CM | POA: Diagnosis not present

## 2021-05-12 NOTE — Progress Notes (Signed)
Chief Complaint  Patient presents with   Follow-up    New rm, alone, c/o of faical spasm on lefts side, double vision remains the same, reports migraines are well under control       ASSESSMENT AND PLAN  Brittany Padilla is a 55 y.o. female   Horizontal double vision since January 2022  Not responding to Mestinon, there is no bulbar limb muscle weakness,  Differentiation diagnosis remains neuromuscular junctional disorder, complete myasthenia gravis panel, including musk antibody  The other possibility would be rapid weight gain, intracranial hypertension, with no papillary edema,  Get medical record from optometrist Dr. Delman Cheadle  If she continues to have double vision by next follow-up visit in February 2022, may consider referral to formal ophthalmology evaluation  Chronic migraine headaches  As needed NSAID     DIAGNOSTIC DATA (LABS, IMAGING, TESTING) - I reviewed patient records, labs, notes, testing and imaging myself where available.   MEDICAL HISTORY:  Brittany Padilla, is a 55 year old female, was seen by Dr. Jannifer Franklin in June 2022 for intermittent double vision, was referred by her primary care physician Dr.  Sueanne Margarita, DO   I reviewed and summarized the referring note.  Past medical history Depression Hypothyroidism, on supplement, Chronic migraine headaches, Acid reflux, Obstructive sleep apnea  She first noticed double vision in January 2022, while driving long distance, she suddenly realized the tail light of the car in front of her became doubled, if she closes 1 eye, the double vision will disappear.  Since then, she began to have intermittent horizontal double vision, seems to be worst when she looks to the left, she was seen by her ophthalmologist, there was no significant abnormality found other than her intermittent double vision, especially with far vision  She denies bulbar or limb muscle weakness, concerning about possible neuromuscular junctional  disorder by Dr. Jannifer Franklin, she was giving a trial of Mestinon, without helping her symptoms,  She reported a long history of migraine headaches, getting worse as correlate with her onset of double vision, couple times each month, Aleve as needed was helpful,  Previously tried Topamax as preventive medications, could not tolerated due to facial muscle spasm, Imitrex causing sore throat,  She does complains of more than 10 pound weight gain over the past 1 year I personally reviewed MRI of the brain with without contrast in June 2022, consistent with small vessel disease no acute abnormality MRI of the brain showed no significant abnormality,   PHYSICAL EXAM:   Vitals:   05/12/21 1021  BP: 128/83  Pulse: 69  Weight: 203 lb (92.1 kg)  Height: 5\' 3"  (1.6 m)   Not recorded     Body mass index is 35.96 kg/m.  PHYSICAL EXAMNIATION:  Gen: NAD, conversant, well nourised, well groomed                     Cardiovascular: Regular rate rhythm, no peripheral edema, warm, nontender. Eyes: Conjunctivae clear without exudates or hemorrhage Neck: Supple, no carotid bruits. Pulmonary: Clear to auscultation bilaterally   NEUROLOGICAL EXAM:  MENTAL STATUS: Speech/cognition:  awake, alert, oriented to history taking and casual conversation:      CRANIAL NERVES: CN II: Visual fields are full to confrontation. Pupils are round equal and briskly reactive to light. CN III, IV, VI: extraocular movement are normal. No ptosis. Red lens testing showed left lateral rectus muscle weakness CN V: Facial sensation is intact to light touch CN VII: Face is symmetric with  normal eye closure  CN VIII: Hearing is normal to causal conversation. CN IX, X: Phonation is normal. CN XI: Head turning and shoulder shrug are intact  MOTOR: There is no pronator drift of out-stretched arms. Muscle bulk and tone are normal. Muscle strength is normal.  REFLEXES: Reflexes are 2+ and symmetric at the biceps, triceps,  knees, and ankles. Plantar responses are flexor.  SENSORY: Intact to light touch, pinprick and vibratory sensation are intact in fingers and toes.  COORDINATION: There is no trunk or limb dysmetria noted.  GAIT/STANCE: Posture is normal. Gait is steady   REVIEW OF SYSTEMS:  Full 14 system review of systems performed and notable only for as above All other review of systems were negative.   ALLERGIES: Allergies  Allergen Reactions   Ansaid [Flurbiprofen]     Prefers not too   Celebrex [Celecoxib] Other (See Comments)    Red itchy hands and feet and roof of mouth itched   Ibuprofen     HOME MEDICATIONS: Current Outpatient Medications  Medication Sig Dispense Refill   cetirizine (ZYRTEC) 10 MG tablet one po qd.     famotidine (PEPCID) 20 MG tablet Take 20 mg by mouth 2 (two) times daily.     levothyroxine (SYNTHROID) 75 MCG tablet TAKE 1 TABLET BY MOUTH EVERY MORNING ON AN EMPTY STOMACH 90 tablet 3   Probiotic Product (PROBIOTIC PO) Take by mouth. It is in her fiber pill     sertraline (ZOLOFT) 50 MG tablet TAKE 1 TABLET BY MOUTH ONCE DAILY IF NOT TOLERATED CAN TAKE 1/2 TABLET FOR 2 WEEKS 173 tablet 0   sertraline (ZOLOFT) 50 MG tablet Take 1 tablet (50 mg total) by mouth daily. 90 tablet 0   sucralfate (CARAFATE) 1 g tablet Take 1 tablet by mouth on an empty stomach 4 times daily 120 tablet 0   VITAMIN D PO Take by mouth daily. 10000 units     No current facility-administered medications for this visit.    PAST MEDICAL HISTORY: Past Medical History:  Diagnosis Date   Anxiety    Clostridium difficile infection 2008   Colon polyps    Depression    Diverticulosis    GERD (gastroesophageal reflux disease)    Irritable bowel syndrome (IBS)    Migraines    Obesity    PVC (premature ventricular contraction)    when stressed   Sleep apnea    mild    Thyroid disease    2010 nodules only   Viral meningitis 1998    PAST SURGICAL HISTORY: Past Surgical History:   Procedure Laterality Date   APPENDECTOMY  1998   CHOLECYSTECTOMY  2000   COLONOSCOPY     ESOPHAGOGASTRODUODENOSCOPY      FAMILY HISTORY: Family History  Problem Relation Age of Onset   Lung cancer Mother    Colonic polyp Mother    Sleep apnea Mother    Lung cancer Father    Breast cancer Sister    Tremor Sister    Colon cancer Neg Hx    Esophageal cancer Neg Hx    Rectal cancer Neg Hx     SOCIAL HISTORY: Social History   Socioeconomic History   Marital status: Divorced    Spouse name: Not on file   Number of children: 0   Years of education: Not on file   Highest education level: Not on file  Occupational History   Occupation: Research scientist (medical)  Tobacco Use   Smoking status: Never   Smokeless  tobacco: Never  Vaping Use   Vaping Use: Never used  Substance and Sexual Activity   Alcohol use: Yes    Comment: rare   Drug use: Never   Sexual activity: Not on file  Other Topics Concern   Not on file  Social History Narrative   Not on file   Social Determinants of Health   Financial Resource Strain: Not on file  Food Insecurity: Not on file  Transportation Needs: Not on file  Physical Activity: Not on file  Stress: Not on file  Social Connections: Not on file  Intimate Partner Violence: Not on file      Marcial Pacas, M.D. Ph.D.  [facility] [address], [place], [place] [address] Ph: [phone] Fax: [phone]  CC:  Sueanne Margarita, [place],  [place] [address]  Sueanne Margarita, DO

## 2021-05-14 ENCOUNTER — Telehealth: Payer: Self-pay | Admitting: Neurology

## 2021-05-14 NOTE — Telephone Encounter (Signed)
Get medical record from her optometrist Dr. Melissa Noon

## 2021-05-21 LAB — THYROID PANEL WITH TSH
Free Thyroxine Index: 2.4 (ref 1.2–4.9)
T3 Uptake Ratio: 26 % (ref 24–39)
T4, Total: 9.1 ug/dL (ref 4.5–12.0)
TSH: 1.87 u[IU]/mL (ref 0.450–4.500)

## 2021-05-21 LAB — ACETYLCHOLINE RECEPTOR AB, ALL
AChR Binding Ab, Serum: <0.03 nmol/L (ref 0.00–0.24)
Acetylchol Block Ab: 8 % (ref 0–25)

## 2021-05-21 LAB — MUSK ANTIBODIES: MuSK Antibodies: <1 U/mL

## 2021-06-09 ENCOUNTER — Other Ambulatory Visit (HOSPITAL_BASED_OUTPATIENT_CLINIC_OR_DEPARTMENT_OTHER): Payer: Self-pay

## 2021-06-09 MED ORDER — LEVOTHYROXINE SODIUM 75 MCG PO TABS
ORAL_TABLET | ORAL | 3 refills | Status: DC
  Filled 2021-06-09: qty 90, 90d supply, fill #0
  Filled 2021-09-17: qty 90, 90d supply, fill #1
  Filled 2022-01-01: qty 62, 62d supply, fill #2
  Filled 2022-03-09: qty 62, 62d supply, fill #3

## 2021-06-09 MED ORDER — LEVOTHYROXINE SODIUM 75 MCG PO TABS
ORAL_TABLET | ORAL | 6 refills | Status: DC
  Filled 2021-06-09: qty 30, 30d supply, fill #0

## 2021-07-04 ENCOUNTER — Other Ambulatory Visit (HOSPITAL_BASED_OUTPATIENT_CLINIC_OR_DEPARTMENT_OTHER): Payer: Self-pay

## 2021-07-04 ENCOUNTER — Ambulatory Visit: Payer: 59 | Attending: Internal Medicine

## 2021-07-04 DIAGNOSIS — Z23 Encounter for immunization: Secondary | ICD-10-CM

## 2021-07-04 MED ORDER — PFIZER COVID-19 VAC BIVALENT 30 MCG/0.3ML IM SUSP
INTRAMUSCULAR | 0 refills | Status: DC
  Filled 2021-07-04: qty 0.3, 1d supply, fill #0

## 2021-07-04 NOTE — Progress Notes (Signed)
° °  Covid-19 Vaccination Clinic  Name:  Brittany Padilla    MRN: [id number] DOB: 05-23-1966  07/04/2021  [name] was observed post Covid-19 immunization for 15 minutes without incident. She was provided with Vaccine Information Sheet and instruction to access the V-Safe system.   [name] was instructed to call 911 with any severe reactions post vaccine: Difficulty breathing  Swelling of face and throat  A fast heartbeat  A bad rash all over body  Dizziness and weakness   Immunizations Administered     Name Date Dose VIS Date Route   Pfizer Covid-19 Vaccine Bivalent Booster 07/04/2021  2:18 PM 0.3 mL 03/19/2021 Intramuscular   Manufacturer: [place]   Lot: [id number]   [place]: [phone]

## 2021-07-22 DIAGNOSIS — H532 Diplopia: Secondary | ICD-10-CM | POA: Diagnosis not present

## 2021-07-24 ENCOUNTER — Telehealth: Payer: Self-pay | Admitting: Neurology

## 2021-07-24 NOTE — Telephone Encounter (Signed)
Received report from [facility], was seen in their office July 22, 2021 for stable diplopia when prism compensated.  Impression was primary convergence insufficiency.  Recommended continue prisms PRN for now. Was seen by Melissa Noon, OD.   Findings: Patient has maintained a stable angle of 6+ dioptors esophoria at distance and ortho at near consistent with primary divergence insufficiency via unremarkable neuro findings, age of onset, and relatively high myopia.   She is seeing me in the office in May.

## 2021-08-11 ENCOUNTER — Other Ambulatory Visit (HOSPITAL_BASED_OUTPATIENT_CLINIC_OR_DEPARTMENT_OTHER): Payer: Self-pay

## 2021-08-11 MED ORDER — SERTRALINE HCL 50 MG PO TABS
50.0000 mg | ORAL_TABLET | Freq: Every day | ORAL | 0 refills | Status: DC
  Filled 2021-08-11: qty 90, 90d supply, fill #0

## 2021-08-20 ENCOUNTER — Ambulatory Visit: Payer: 59 | Admitting: Adult Health

## 2021-08-20 ENCOUNTER — Other Ambulatory Visit: Payer: Self-pay

## 2021-08-20 ENCOUNTER — Encounter: Payer: Self-pay | Admitting: Adult Health

## 2021-08-20 ENCOUNTER — Telehealth: Payer: Self-pay | Admitting: Adult Health

## 2021-08-20 VITALS — BP 134/89 | HR 80 | Ht 63.0 in | Wt 206.8 lb

## 2021-08-20 DIAGNOSIS — H532 Diplopia: Secondary | ICD-10-CM

## 2021-08-20 DIAGNOSIS — Z9989 Dependence on other enabling machines and devices: Secondary | ICD-10-CM

## 2021-08-20 DIAGNOSIS — G4733 Obstructive sleep apnea (adult) (pediatric): Secondary | ICD-10-CM | POA: Diagnosis not present

## 2021-08-20 NOTE — Telephone Encounter (Signed)
Sent to Dr. Groat ph # [phone] 

## 2021-08-20 NOTE — Progress Notes (Signed)
PATIENT: Brittany Padilla DOB: October 01, 1965  REASON FOR VISIT: follow up HISTORY FROM: patient PRIMARY NEUROLOGIST: Dr. Krista Blue  HISTORY OF PRESENT ILLNESS: Today 08/20/21  Ms. Brittany Padilla is a 56 year old female with a history of obstructive sleep apnea on CPAP.  She reports that she has been having a hard time using the CPAP due to nosebleeds.  She also feels that her humidification is not working.  Her download indicates that she use her machine 20 out of the last 30 days for compliance of 66%.  On average she will use her machine 2 hours and 14 minutes.  Her residual AHI is 2.5 on 4 to 6 cm of water.  Advised patient that a mask refitting may be beneficial as well as having her DME company look at her CPAP machine.  Patient also initially saw Dr. Jannifer Franklin and then Dr. Collene Mares for double vision.  Her work-up with [place] has remained relatively unremarkable.  She has been seeing an optometrist but not an ophthalmologist.  Reports that since she got her latest COVID booster.  She does feel better but still has days that she has double vision.  Tends to notice it more when she is in the car or moving.  Mainly affects only the left eye.  Did a trial on Mestinon with Dr. Jannifer Franklin but did not notice any benefit.  Returns today for an evaluation.  HISTORY (copied from Dr. Guadelupe Sabin note)  Brittany Padilla is a 56 year old right-handed woman with an underlying medical history of allergies, reflux disease, hypothyroidism, migraines, diplopia (recently seen by my colleague, Dr. Jannifer Franklin), irritable bowel syndrome, history of diverticulitis, and obesity, who was previously diagnosed with obstructive sleep apnea and was placed on PAP therapy.  Sleep testing was in March 2019.  I was able to review her home sleep test report from 09/30/2017.  Study was done in [place] at the time.  Her overall AHI was 8/h, O2 nadir 87.7%.  She was started on AutoPap therapy.  She has a Social worker.  She has registered her machine back  in June as she was notified of the recall on the [employer].  I was able to review a compliance report as well.  She recently had some weight increase.  She is working on lifestyle modification, trying to exercise more.  She has been trying to get back on her CPAP machine.  It looks like she has been on an AutoPap machine, 90th percentile of pressure at 5.8 cm, average AHI 2.6, compliance data was reviewed from 01/18/2021 through 02/16/2021.  Average usage was 2 hours and 49 minutes, leak on the higher side. Her biggest issue is trying to stay asleep.  She has tried over-the-counter medication including Benadryl, melatonin about 5 mg, and other supplements, natural remedies.  Melatonin gave her headache.  She has had good success in reducing her double vision with prism eyeglasses.  She goes to bed between 930 and 10 and rise time is generally between 5:15 AM and 5:45 AM.  She has woken up occasionally with a headache and has nocturia about once or twice per average night.  She is divorced and lives alone, 1 cat and 2 dogs in the household.  Sometimes her pets wake her up in the middle of the night, most of the time it is no apparent reason.  It is hard for her to maintain treatment with her AutoPap machine once she is up in the middle of the night.  She limits her caffeine  to 2 cups of coffee in the morning, drinks alcohol rarely, is a non-smoker.  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  She does not have a local DME company yet.  She has been using nasal pillows and has extra supplies.  Of note, she has been on sertraline, has been on an antidepressant off and on for years.  She is currently taking the sertraline in the mornings.  REVIEW OF SYSTEMS: Out of a complete 14 system review of symptoms, the patient complains only of the following symptoms, and all other reviewed systems are negative.  ESS 2  ALLERGIES: Allergies  Allergen Reactions   Ansaid [Flurbiprofen]     Prefers not too    Celebrex [Celecoxib] Other (See Comments)    Red itchy hands and feet and roof of mouth itched   Ibuprofen     HOME MEDICATIONS: Outpatient Medications Prior to Visit  Medication Sig Dispense Refill   cetirizine (ZYRTEC) 10 MG tablet one po qd.     famotidine (PEPCID) 20 MG tablet Take 20 mg by mouth 2 (two) times daily.     fexofenadine-pseudoephedrine (ALLEGRA-D 24) 180-240 MG 24 hr tablet Take 1 tablet by mouth daily.     levothyroxine (SYNTHROID) 75 MCG tablet Take 1 tablet by mouth in the morning on an empty stomach  once a day 90 tablet 3   levothyroxine (SYNTHROID) 75 MCG tablet Take 1 tablet by mouth in the morning on an empty stomach  once a day 30 tablet 6   Probiotic Product (PROBIOTIC PO) Take by mouth. It is in her fiber pill     sertraline (ZOLOFT) 50 MG tablet TAKE 1 TABLET BY MOUTH ONCE DAILY IF NOT TOLERATED CAN TAKE 1/2 TABLET FOR 2 WEEKS 173 tablet 0   sertraline (ZOLOFT) 50 MG tablet Take 1 tablet (50 mg total) by mouth daily. 90 tablet 0   sucralfate (CARAFATE) 1 g tablet Take 1 tablet by mouth on an empty stomach 4 times daily 120 tablet 0   VITAMIN D PO Take by mouth daily. 10000 units     COVID-19 mRNA bivalent vaccine, Pfizer, (PFIZER COVID-19 VAC BIVALENT) injection Inject into the muscle. 0.3 mL 0   levothyroxine (SYNTHROID) 75 MCG tablet TAKE 1 TABLET BY MOUTH EVERY MORNING ON AN EMPTY STOMACH 90 tablet 3   No facility-administered medications prior to visit.    PAST MEDICAL HISTORY: Past Medical History:  Diagnosis Date   Anxiety    Clostridium difficile infection 2008   Colon polyps    Depression    Diverticulosis    GERD (gastroesophageal reflux disease)    Irritable bowel syndrome (IBS)    Migraines    Obesity    PVC (premature ventricular contraction)    when stressed   Sleep apnea    mild    Thyroid disease    2010 nodules only   Viral meningitis 1998    PAST SURGICAL HISTORY: Past Surgical History:  Procedure Laterality Date    APPENDECTOMY  1998   CHOLECYSTECTOMY  2000   COLONOSCOPY     ESOPHAGOGASTRODUODENOSCOPY      FAMILY HISTORY: Family History  Problem Relation Age of Onset   Lung cancer Mother    Colonic polyp Mother    Sleep apnea Mother    Lung cancer Father    Breast cancer Sister    Sleep apnea Sister    Tremor Sister    Colon cancer Neg Hx    Esophageal cancer Neg  Hx    Rectal cancer Neg Hx     SOCIAL HISTORY: Social History   Socioeconomic History   Marital status: Divorced    Spouse name: Not on file   Number of children: 0   Years of education: Not on file   Highest education level: Not on file  Occupational History   Occupation: Research scientist (medical)  Tobacco Use   Smoking status: Never   Smokeless tobacco: Never  Vaping Use   Vaping Use: Never used  Substance and Sexual Activity   Alcohol use: Yes    Comment: rare   Drug use: Never   Sexual activity: Not on file  Other Topics Concern   Not on file  Social History Narrative   Not on file   Social Determinants of Health   Financial Resource Strain: Not on file  Food Insecurity: Not on file  Transportation Needs: Not on file  Physical Activity: Not on file  Stress: Not on file  Social Connections: Not on file  Intimate Partner Violence: Not on file      PHYSICAL EXAM  Vitals:   08/20/21 0905  BP: 134/89  Pulse: 80  Weight: 206 lb 12.8 oz (93.8 kg)  Height: 5\' 3"  (1.6 m)   Body mass index is 36.63 kg/m.  Generalized: Well developed, in no acute distress  Chest: Lungs clear to auscultation bilaterally  Neurological examination  Mentation: Alert oriented to time, place, history taking. Follows all commands speech and language fluent Cranial nerve II-XII: Extraocular movements were full, visual field were full on confrontational test Head turning and shoulder shrug  were normal and symmetric. Motor: The motor testing reveals 5 over 5 strength of all 4 extremities. Good symmetric motor tone is noted  throughout.  Sensory: Sensory testing is intact to soft touch on all 4 extremities. No evidence of extinction is noted.  Gait and station: Gait is normal.    DIAGNOSTIC DATA (LABS, IMAGING, TESTING) - I reviewed patient records, labs, notes, testing and imaging myself where available.   Lab Results  Component Value Date   HGBA1C 5.6 01/08/2021   Lab Results  Component Value Date   ZMOQHUTM54 650 01/08/2021   Lab Results  Component Value Date   TSH 1.870 05/12/2021      ASSESSMENT AND PLAN 56 y.o. year old female  has a past medical history of Anxiety, Clostridium difficile infection (2008), Colon polyps, Depression, Diverticulosis, GERD (gastroesophageal reflux disease), Irritable bowel syndrome (IBS), Migraines, Obesity, PVC (premature ventricular contraction), Sleep apnea, Thyroid disease, and Viral meningitis (1998). here with:  OSA on CPAP  - CPAP compliance suboptimal - Good treatment of AHI when using the machine - Encourage patient to use CPAP nightly and > 4 hours each night -Mask refitting ordered and asked that the DME evaluate her humidification  2.  Diplopia  -Work-up with our office has been unremarkable -Referral placed to ophthalmology for an evaluation  - F/U in 1 year or sooner if needed   Ward Givens, MSN, NP-C 08/20/2021, 9:12 AM [facility] [address] [place], [place] [address] [phone]

## 2021-08-25 NOTE — Progress Notes (Signed)
Message Received: 4 days ago Denyse Amass, RN; Vanessa Ralphs got it      Previous Messages   ----- Message -----  From: Brandon Melnick, RN  Sent: 08/20/2021   4:57 PM EST  To: Ocie Bob, *   New order in [place].  Mask refitting.   Salomon Mast. Raimi Guillermo"  Female, 56 y.o., 13-Jun-1966  MRN:  [id number]  Thanks Lovey Newcomer RN

## 2021-08-27 DIAGNOSIS — H2513 Age-related nuclear cataract, bilateral: Secondary | ICD-10-CM | POA: Diagnosis not present

## 2021-08-27 DIAGNOSIS — H532 Diplopia: Secondary | ICD-10-CM | POA: Diagnosis not present

## 2021-08-27 DIAGNOSIS — H5051 Esophoria: Secondary | ICD-10-CM | POA: Diagnosis not present

## 2021-08-27 DIAGNOSIS — H40013 Open angle with borderline findings, low risk, bilateral: Secondary | ICD-10-CM | POA: Diagnosis not present

## 2021-09-17 ENCOUNTER — Other Ambulatory Visit (HOSPITAL_BASED_OUTPATIENT_CLINIC_OR_DEPARTMENT_OTHER): Payer: Self-pay

## 2021-10-14 DIAGNOSIS — J351 Hypertrophy of tonsils: Secondary | ICD-10-CM | POA: Diagnosis not present

## 2021-10-14 DIAGNOSIS — H532 Diplopia: Secondary | ICD-10-CM | POA: Diagnosis not present

## 2021-10-14 DIAGNOSIS — Z6835 Body mass index (BMI) 35.0-35.9, adult: Secondary | ICD-10-CM | POA: Diagnosis not present

## 2021-10-14 DIAGNOSIS — Z1339 Encounter for screening examination for other mental health and behavioral disorders: Secondary | ICD-10-CM | POA: Diagnosis not present

## 2021-10-14 DIAGNOSIS — G4733 Obstructive sleep apnea (adult) (pediatric): Secondary | ICD-10-CM | POA: Diagnosis not present

## 2021-10-14 DIAGNOSIS — F325 Major depressive disorder, single episode, in full remission: Secondary | ICD-10-CM | POA: Diagnosis not present

## 2021-10-14 DIAGNOSIS — Z1331 Encounter for screening for depression: Secondary | ICD-10-CM | POA: Diagnosis not present

## 2021-10-14 DIAGNOSIS — E039 Hypothyroidism, unspecified: Secondary | ICD-10-CM | POA: Diagnosis not present

## 2021-11-19 ENCOUNTER — Ambulatory Visit: Payer: 59 | Admitting: Neurology

## 2021-12-03 ENCOUNTER — Other Ambulatory Visit: Payer: Self-pay | Admitting: Obstetrics and Gynecology

## 2021-12-03 DIAGNOSIS — N644 Mastodynia: Secondary | ICD-10-CM | POA: Diagnosis not present

## 2021-12-09 ENCOUNTER — Other Ambulatory Visit (HOSPITAL_BASED_OUTPATIENT_CLINIC_OR_DEPARTMENT_OTHER): Payer: Self-pay

## 2021-12-09 MED ORDER — CYCLOBENZAPRINE HCL 10 MG PO TABS
5.0000 mg | ORAL_TABLET | Freq: Every evening | ORAL | 0 refills | Status: DC | PRN
  Filled 2021-12-09: qty 20, 20d supply, fill #0

## 2021-12-11 ENCOUNTER — Ambulatory Visit
Admission: RE | Admit: 2021-12-11 | Discharge: 2021-12-11 | Disposition: A | Discharge status: Home / Self Care | Payer: 59 | Source: Ambulatory Visit | Attending: Obstetrics and Gynecology | Admitting: Obstetrics and Gynecology

## 2021-12-11 DIAGNOSIS — N644 Mastodynia: Secondary | ICD-10-CM | POA: Diagnosis not present

## 2021-12-11 DIAGNOSIS — R928 Other abnormal and inconclusive findings on diagnostic imaging of breast: Secondary | ICD-10-CM | POA: Diagnosis not present

## 2022-01-02 ENCOUNTER — Other Ambulatory Visit (HOSPITAL_BASED_OUTPATIENT_CLINIC_OR_DEPARTMENT_OTHER): Payer: Self-pay

## 2022-01-09 ENCOUNTER — Other Ambulatory Visit (HOSPITAL_BASED_OUTPATIENT_CLINIC_OR_DEPARTMENT_OTHER): Payer: Self-pay

## 2022-01-13 DIAGNOSIS — E039 Hypothyroidism, unspecified: Secondary | ICD-10-CM | POA: Diagnosis not present

## 2022-01-13 DIAGNOSIS — R1319 Other dysphagia: Secondary | ICD-10-CM | POA: Diagnosis not present

## 2022-01-13 DIAGNOSIS — M9903 Segmental and somatic dysfunction of lumbar region: Secondary | ICD-10-CM | POA: Diagnosis not present

## 2022-01-13 DIAGNOSIS — M545 Low back pain, unspecified: Secondary | ICD-10-CM | POA: Diagnosis not present

## 2022-01-13 DIAGNOSIS — E559 Vitamin D deficiency, unspecified: Secondary | ICD-10-CM | POA: Diagnosis not present

## 2022-01-13 DIAGNOSIS — M9904 Segmental and somatic dysfunction of sacral region: Secondary | ICD-10-CM | POA: Diagnosis not present

## 2022-01-13 DIAGNOSIS — M2170 Unequal limb length (acquired), unspecified site: Secondary | ICD-10-CM | POA: Diagnosis not present

## 2022-01-13 DIAGNOSIS — J351 Hypertrophy of tonsils: Secondary | ICD-10-CM | POA: Diagnosis not present

## 2022-01-30 ENCOUNTER — Other Ambulatory Visit (HOSPITAL_BASED_OUTPATIENT_CLINIC_OR_DEPARTMENT_OTHER): Payer: Self-pay

## 2022-02-09 ENCOUNTER — Other Ambulatory Visit (HOSPITAL_BASED_OUTPATIENT_CLINIC_OR_DEPARTMENT_OTHER): Payer: Self-pay

## 2022-02-09 MED ORDER — SERTRALINE HCL 50 MG PO TABS
50.0000 mg | ORAL_TABLET | Freq: Every day | ORAL | 0 refills | Status: DC
  Filled 2022-02-09: qty 90, 90d supply, fill #0

## 2022-02-19 ENCOUNTER — Other Ambulatory Visit (HOSPITAL_BASED_OUTPATIENT_CLINIC_OR_DEPARTMENT_OTHER): Payer: Self-pay

## 2022-02-19 ENCOUNTER — Encounter: Payer: Self-pay | Admitting: Gastroenterology

## 2022-02-19 ENCOUNTER — Ambulatory Visit: Payer: 59 | Admitting: Gastroenterology

## 2022-02-19 VITALS — BP 114/72 | HR 66 | Ht 63.5 in | Wt 202.0 lb

## 2022-02-19 DIAGNOSIS — R1310 Dysphagia, unspecified: Secondary | ICD-10-CM

## 2022-02-19 DIAGNOSIS — Z8719 Personal history of other diseases of the digestive system: Secondary | ICD-10-CM | POA: Diagnosis not present

## 2022-02-19 DIAGNOSIS — K219 Gastro-esophageal reflux disease without esophagitis: Secondary | ICD-10-CM

## 2022-02-19 MED ORDER — LANSOPRAZOLE 15 MG PO CPDR
15.0000 mg | DELAYED_RELEASE_CAPSULE | Freq: Every day | ORAL | 3 refills | Status: DC
  Filled 2022-02-19: qty 30, 30d supply, fill #0
  Filled 2022-08-27: qty 30, 30d supply, fill #1
  Filled 2022-11-13: qty 30, 30d supply, fill #2
  Filled 2022-12-10: qty 30, 30d supply, fill #3

## 2022-02-19 NOTE — Progress Notes (Signed)
02/19/2022 Brittany Padilla [id number] [birth year]   HISTORY OF PRESENT ILLNESS: This is a 56 year old female is a patient of Dr. Ardis Hughs.  She was seen by him January 2021 to establish care.  Has a history of IBS with constipation predominance, personal history of colon polyps, chronic dysphagia.  She not been seen here since that time.  She is here today with complaints of ongoing or worsening dysphagia.  She says its been a ongoing issue that she is just really want to deal with.  She says that typically when she is eating she has to kind of stretch out/sit up tall and drinks water so that her food will go down.  Recently, however, she had an episode where she was eating quickly and her fluid immediately came right back up.  She is never been on anything for acid reflux but she is concerned about PPI use due to history of C. difficile in the past.  She did start him on Prevacid 15 mg OTC recently, however.  Has a history of esophageal stricture on that was last dilated in 2018 to 18 mm balloon.  This is listed below.  Previous GI evaluation:  Gastric emptying scan December 2011 was normal   Pathology results from colonoscopy and upper endoscopy October 2018: Polyps from her colon were all hyperplastic.  GE junction showed GERD damage.  No eosinophilic esophagitis.  The duodenum was normal.  The stomach showed mild chronic gastritis without H. Pylori.   EGD report October 2018 indications GERD and dysphagia.  Findings "benign intrinsic 13 mm stricture was seen at the GE junction" it was dilated to 15 mm with a balloon.  Esophagitis was seen as well.  Biopsies were taken from a normal esophagus.   Colonoscopy report October 2018 described small 4 mm polyp in the sigmoid colon, diverticulosis in the left colon.  The examination was otherwise normal.   Blood work December 2010 celiac sprue panel was all negative including TTG and total IgA   EGD report November 2011 done for dysphagia and GERD  described normal duodenum, hiatal hernia, erythema in the lower esophagus.  Otherwise normal examination.  Biopsies were taken from her stomach I believe.   Sits marker testing November 2011 showed "no remaining sits markers"   CT scan chest abdomen pelvis with IV contrast done for "dysphagia and right upper quadrant pain" showed status post cholecystectomy, status post appendectomy, the examination was otherwise normal   Blood work November 2020 showed completely normal complete metabolic profile, normal CBC normal hemoglobin A1c   Esophageal manometry report November 2011 was "normal"   Ambulatory pH monitoring test November 2011 showed "poor gastric acid suppression" also "positive symptom association with regurgitation of throat pain" this was done while on medicines at Nexium 40 mg once daily    Stool Hemoccult testing December 2011 was negative   There is an office note from believe her gastroenterologist dated April 2018 in this they alluded to a "recent upper GI report where a Schatzki's ring in her distal esophagus was described"   Past Medical History:  Diagnosis Date   Anxiety    Clostridium difficile infection 2008   Colon polyps    Depression    Diverticulosis    GERD (gastroesophageal reflux disease)    Irritable bowel syndrome (IBS)    Migraines    Obesity    PVC (premature ventricular contraction)    when stressed   Sleep apnea    mild  Thyroid disease    2010 nodules only   Viral meningitis 1998   Past Surgical History:  Procedure Laterality Date   APPENDECTOMY  1998   CHOLECYSTECTOMY  2000   COLONOSCOPY     ESOPHAGOGASTRODUODENOSCOPY      reports that she has never smoked. She has never used smokeless tobacco. She reports current alcohol use. She reports that she does not use drugs. family history includes Breast cancer in her sister; Colonic polyp in her mother; Lung cancer in her father and mother; Sleep apnea in her mother and sister; Tremor in her  sister. Allergies  Allergen Reactions   Ansaid [Flurbiprofen]     Prefers not too   Celebrex [Celecoxib] Other (See Comments)    Red itchy hands and feet and roof of mouth itched   Ibuprofen       Outpatient Encounter Medications as of 02/19/2022  Medication Sig   cetirizine (ZYRTEC) 10 MG tablet one po qd.   cyclobenzaprine (FLEXERIL) 10 MG tablet Take 0.5-1 tablets (5-10 mg total) by mouth at bedtime as needed, and as tolerated can increase to 3 times daily as needed.   fexofenadine-pseudoephedrine (ALLEGRA-D 24) 180-240 MG 24 hr tablet Take 1 tablet by mouth daily.   levothyroxine (SYNTHROID) 75 MCG tablet Take 1 tablet by mouth in the morning on an empty stomach  once a day   levothyroxine (SYNTHROID) 75 MCG tablet Take 1 tablet by mouth in the morning on an empty stomach  once a day   Omeprazole Magnesium (PRILOSEC OTC PO) Take by mouth.   Probiotic Product (PROBIOTIC PO) Take by mouth. It is in her fiber pill   sertraline (ZOLOFT) 50 MG tablet Take 1 tablet (50 mg total) by mouth daily.   sucralfate (CARAFATE) 1 g tablet Take 1 tablet by mouth on an empty stomach 4 times daily   VITAMIN D PO Take by mouth daily. 10000 units   levothyroxine (SYNTHROID) 75 MCG tablet TAKE 1 TABLET BY MOUTH EVERY MORNING ON AN EMPTY STOMACH   sertraline (ZOLOFT) 50 MG tablet TAKE 1 TABLET BY MOUTH ONCE DAILY IF NOT TOLERATED CAN TAKE 1/2 TABLET FOR 2 WEEKS   [DISCONTINUED] COVID-19 mRNA bivalent vaccine, Pfizer, (PFIZER COVID-19 VAC BIVALENT) injection Inject into the muscle.   [DISCONTINUED] famotidine (PEPCID) 20 MG tablet Take 20 mg by mouth 2 (two) times daily.   No facility-administered encounter medications on file as of 02/19/2022.     REVIEW OF SYSTEMS  : All other systems reviewed and negative except where noted in the History of Present Illness.   PHYSICAL EXAM: BP 114/72   Pulse 66   Ht 5' 3.5" (1.613 m)   Wt 202 lb (91.6 kg)   SpO2 98%   BMI 35.22 kg/m  General: Well developed  white female in no acute distress Head: Normocephalic and atraumatic Eyes:  Sclerae anicteric, conjunctiva pink. Ears: Normal auditory acuity Lungs: Clear throughout to auscultation; no W/R/R. Heart: Regular rate and rhythm; no M/R/G. Abdomen: Soft, non-distended.  BS present.  Non-tender. Musculoskeletal: Symmetrical with no gross deformities  Skin: No lesions on visible extremities Extremities: No edema  Neurological: Alert oriented x 4, grossly non-focal Psychological:  Alert and cooperative. Normal mood and affect  ASSESSMENT AND PLAN: *Dysphagia, GERD, history of Schatzki's ring vs esophageal stricture: Last EGD with dilation of benign intrinsic esophageal stricture was in 2018.  This was dilated to 18 mm balloon.  Had not been on any acid reflux medications until recently.  She is on  OTC Prevacid right now, but she gets concerned about PPIs with her history of C. difficile in the past.  We discussed possibly even trying Pepcid a couple times daily.  We will plan for EGD with Dr. Tarri Glenn in Dr. Ardis Hughs absence.  The risks, benefits, and alternatives to EGD with dilation were discussed with the patient and she consents to proceed.    CC:  Sueanne Margarita, DO

## 2022-02-19 NOTE — Patient Instructions (Signed)
You have been scheduled for an endoscopy. Please follow written instructions given to you at your visit today. If you use inhalers (even only as needed), please bring them with you on the day of your procedure.   We have sent the following medications to your pharmacy for you to pick up at your convenience:   Prevacid 15 mg  Due to recent changes in healthcare laws, you may see the results of your imaging and laboratory studies on MyChart before your provider has had a chance to review them.  We understand that in some cases there may be results that are confusing or concerning to you. Not all laboratory results come back in the same time frame and the provider may be waiting for multiple results in order to interpret others.  Please give [place] 48 hours in order for your provider to thoroughly review all the results before contacting the office for clarification of your results.    _______________________________________________________  If you are age 28 or older, your body mass index should be between 23-30. Your Body mass index is 35.22 kg/m. If this is out of the aforementioned range listed, please consider follow up with your Primary Care Provider.  If you are age 67 or younger, your body mass index should be between 19-25. Your Body mass index is 35.22 kg/m. If this is out of the aformentioned range listed, please consider follow up with your Primary Care Provider.   ________________________________________________________  The  GI providers would like to encourage you to use [facility] to communicate with providers for non-urgent requests or questions.  Due to long hold times on the telephone, sending your provider a message by [facility] may be a faster and more efficient way to get a response.  Please allow 48 business hours for a response.  Please remember that this is for non-urgent requests.  _______________________________________________________   I appreciate the  opportunity to care  for you  Thank You   Janett Billow Zehr,PA-C

## 2022-02-20 ENCOUNTER — Other Ambulatory Visit (HOSPITAL_BASED_OUTPATIENT_CLINIC_OR_DEPARTMENT_OTHER): Payer: Self-pay

## 2022-02-20 DIAGNOSIS — F419 Anxiety disorder, unspecified: Secondary | ICD-10-CM | POA: Diagnosis not present

## 2022-02-20 DIAGNOSIS — R7989 Other specified abnormal findings of blood chemistry: Secondary | ICD-10-CM | POA: Diagnosis not present

## 2022-02-22 NOTE — Progress Notes (Signed)
Reviewed and agree with management plans. Will plan EGD with dilation.   Mattisen Pohlmann L. Tarri Glenn, MD, MPH

## 2022-02-27 ENCOUNTER — Other Ambulatory Visit: Payer: Self-pay | Admitting: Internal Medicine

## 2022-02-27 DIAGNOSIS — R1319 Other dysphagia: Secondary | ICD-10-CM | POA: Diagnosis not present

## 2022-02-27 DIAGNOSIS — E559 Vitamin D deficiency, unspecified: Secondary | ICD-10-CM | POA: Diagnosis not present

## 2022-02-27 DIAGNOSIS — Z0000 Encounter for general adult medical examination without abnormal findings: Secondary | ICD-10-CM | POA: Diagnosis not present

## 2022-02-27 DIAGNOSIS — M545 Low back pain, unspecified: Secondary | ICD-10-CM | POA: Diagnosis not present

## 2022-02-27 DIAGNOSIS — R82998 Other abnormal findings in urine: Secondary | ICD-10-CM | POA: Diagnosis not present

## 2022-02-27 DIAGNOSIS — M2170 Unequal limb length (acquired), unspecified site: Secondary | ICD-10-CM | POA: Diagnosis not present

## 2022-02-27 DIAGNOSIS — Z1389 Encounter for screening for other disorder: Secondary | ICD-10-CM | POA: Diagnosis not present

## 2022-02-27 DIAGNOSIS — Z1331 Encounter for screening for depression: Secondary | ICD-10-CM | POA: Diagnosis not present

## 2022-02-27 DIAGNOSIS — G4733 Obstructive sleep apnea (adult) (pediatric): Secondary | ICD-10-CM | POA: Diagnosis not present

## 2022-02-27 DIAGNOSIS — E039 Hypothyroidism, unspecified: Secondary | ICD-10-CM | POA: Diagnosis not present

## 2022-02-27 DIAGNOSIS — M9904 Segmental and somatic dysfunction of sacral region: Secondary | ICD-10-CM | POA: Diagnosis not present

## 2022-02-27 DIAGNOSIS — F325 Major depressive disorder, single episode, in full remission: Secondary | ICD-10-CM | POA: Diagnosis not present

## 2022-02-27 DIAGNOSIS — Z8249 Family history of ischemic heart disease and other diseases of the circulatory system: Secondary | ICD-10-CM

## 2022-03-01 ENCOUNTER — Encounter: Payer: Self-pay | Admitting: Certified Registered Nurse Anesthetist

## 2022-03-09 ENCOUNTER — Encounter: Payer: Self-pay | Admitting: Gastroenterology

## 2022-03-09 ENCOUNTER — Ambulatory Visit (AMBULATORY_SURGERY_CENTER): Payer: 59 | Admitting: Gastroenterology

## 2022-03-09 ENCOUNTER — Telehealth: Payer: Self-pay

## 2022-03-09 VITALS — BP 131/76 | HR 67 | Temp 97.5°F | Resp 15 | Ht 63.0 in | Wt 202.0 lb

## 2022-03-09 DIAGNOSIS — K219 Gastro-esophageal reflux disease without esophagitis: Secondary | ICD-10-CM

## 2022-03-09 DIAGNOSIS — G4733 Obstructive sleep apnea (adult) (pediatric): Secondary | ICD-10-CM | POA: Diagnosis not present

## 2022-03-09 DIAGNOSIS — Z8719 Personal history of other diseases of the digestive system: Secondary | ICD-10-CM | POA: Diagnosis not present

## 2022-03-09 DIAGNOSIS — K259 Gastric ulcer, unspecified as acute or chronic, without hemorrhage or perforation: Secondary | ICD-10-CM | POA: Diagnosis not present

## 2022-03-09 DIAGNOSIS — K3189 Other diseases of stomach and duodenum: Secondary | ICD-10-CM | POA: Diagnosis not present

## 2022-03-09 DIAGNOSIS — R1310 Dysphagia, unspecified: Secondary | ICD-10-CM | POA: Diagnosis not present

## 2022-03-09 DIAGNOSIS — K2289 Other specified disease of esophagus: Secondary | ICD-10-CM | POA: Diagnosis not present

## 2022-03-09 MED ORDER — SODIUM CHLORIDE 0.9 % IV SOLN: 500.0000 mL | Freq: Once | INTRAVENOUS | Status: DC

## 2022-03-09 NOTE — Progress Notes (Signed)
Called to room to assist during endoscopic procedure.  Patient ID and intended procedure confirmed with present staff. Received instructions for my participation in the procedure from the performing physician.  

## 2022-03-09 NOTE — Telephone Encounter (Signed)
Patient has been scheduled for f/u on 04/08/22 at 2:00 pm with [place], [place]. Pt notified via mychart.

## 2022-03-09 NOTE — Progress Notes (Signed)
Pt's states no medical or surgical changes since previsit or office visit. 

## 2022-03-09 NOTE — Op Note (Signed)
[facility] Patient Name: Brittany Padilla Procedure Date: 03/09/2022 2:28 PM MRN: [id number] Endoscopist: Thornton Park MD, MD Age: 56 Referring MD:  Date of Birth: 07-09-1966 Gender: Female Account #: [id number] Procedure:                Upper GI endoscopy Indications:              Dysphagia, Esophageal reflux symptoms that persist                            despite appropriate therapy Medicines:                Monitored Anesthesia Care Procedure:                Pre-Anesthesia Assessment:                           - Prior to the procedure, a History and Physical                            was performed, and patient medications and                            allergies were reviewed. The patient's tolerance of                            previous anesthesia was also reviewed. The risks                            and benefits of the procedure and the sedation                            options and risks were discussed with the patient.                            All questions were answered, and informed consent                            was obtained. Prior Anticoagulants: The patient has                            taken no previous anticoagulant or antiplatelet                            agents. ASA Grade Assessment: II - A patient with                            mild systemic disease. After reviewing the risks                            and benefits, the patient was deemed in                            satisfactory condition to undergo the procedure.  After obtaining informed consent, the endoscope was                            passed under direct vision. Throughout the                            procedure, the patient's blood pressure, pulse, and                            oxygen saturations were monitored continuously. The                            GIF [id number] #[id number] was introduced through the                            mouth, and advanced to  the third part of duodenum.                            The upper GI endoscopy was accomplished without                            difficulty. The patient tolerated the procedure                            well. Scope In: Scope Out: Findings:                 No endoscopic abnormality was evident in the                            esophagus to explain the patient's complaint of                            dysphagia. It was decided, however, to proceed with                            dilation of the lower third of the esophagus. A TTS                            dilator was passed through the scope. Dilation with                            a 16-17-18 mm balloon dilator was performed to 18                            mm. The fully inflated balloon was easily pulled                            through the mid and distal esophagus without                            resistance. The dilation site was examined and  showed no change. After dilation, biopsies were                            obtained from the mid/proximal and distal esophagus                            with cold forceps for histology.                           A few localized erosions with no bleeding and no                            stigmata of recent bleeding were found in the                            gastric antrum. Biopsies were taken from the                            erosions, antrum, body, and fundus with a cold                            forceps for histology. Estimated blood loss was                            minimal.                           The examined duodenum was normal. Biopsies were                            taken with a cold forceps for histology. Estimated                            blood loss was minimal.                           The cardia and gastric fundus were normal on                            retroflexion.                           The exam was otherwise without  abnormality. Complications:            No immediate complications. Estimated Blood Loss:     Estimated blood loss was minimal. Impression:               - No endoscopic esophageal abnormality to explain                            patient's dysphagia. Esophagus dilated. Biopsied.                           - Erosive gastropathy with no bleeding and no  stigmata of recent bleeding. Biopsied.                           - Normal examined duodenum. Biopsied.                           - The examination was otherwise normal. Recommendation:           - Patient has a contact number available for                            emergencies. The signs and symptoms of potential                            delayed complications were discussed with the                            patient. Return to normal activities tomorrow.                            Written discharge instructions were provided to the                            patient.                           - Resume previous diet.                           - Continue present medications. (she prefers to                            avoid PPI medications as able given a history of C                            diff)                           - No aspirin, ibuprofen, naproxen, or other                            non-steroidal anti-inflammatory drugs.                           - Await pathology results. Thornton Park MD, MD 03/09/2022 3:03:27 PM This report has been signed electronically.

## 2022-03-09 NOTE — Progress Notes (Signed)
Indication for upper endoscopy: Dysphagia, reflux, history of Schatzki's ring versus esophageal stricture on EGD in 2018  Prior records from Dr. Ardis Hughs reviewed. He summarizes care from [place] including: - EGD report November 2011 done for dysphagia and GERD described normal duodenum, hiatal hernia, erythema in the lower esophagus.  Otherwise normal examination.   - EGD report  from October 2018 performed in [place] for indications GERD and dysphagia.  Findings "benign intrinsic 13 mm stricture was seen at the GE junction" it was dilated to 15 mm with a balloon.  Esophagitis was seen as well.  Biopsies were taken from a normal esophagus - There is an office note from believe her gastroenterologist dated April 2018 in this they alluded to a "recent upper GI report where a Schatzki's ring in her distal esophagus was described"  Please see the 02/19/2022 office visit for complete details.  There is been no significant change in history or physical exam.  The patient remains an appropriate candidate for monitored anesthesia care in the endoscopy unit.

## 2022-03-09 NOTE — Progress Notes (Signed)
Report given to PACU, vss 

## 2022-03-09 NOTE — Telephone Encounter (Signed)
-----   Message from Thornton Park, MD sent at 03/09/2022  3:01 PM EDT ----- Please schedule office follow-up with Alonza Bogus in Dr. Ardis Hughs' absence.  Thanks.  KLB

## 2022-03-09 NOTE — Progress Notes (Signed)
1433 Robinul 0.1 mg IV given due large amount of secretions upon assessment.  MD made aware, vss

## 2022-03-09 NOTE — Patient Instructions (Signed)
Handout given for gastritis.  Avoid NSAIDS (Non-Steroidal anti-inflammatory drugs). (These include, aspirin, aspirin-containing products, ibuprofen, advil, motrin, naproxen, aleve, goody powders, etc) Tylenol is ok to take as needed, see label for instructions.   YOU HAD AN ENDOSCOPIC PROCEDURE TODAY AT [facility]:   Refer to the procedure report that was given to you for any specific questions about what was found during the examination.  If the procedure report does not answer your questions, please call your gastroenterologist to clarify.  If you requested that your care partner not be given the details of your procedure findings, then the procedure report has been included in a sealed envelope for you to review at your convenience later.  YOU SHOULD EXPECT: Some feelings of bloating in the abdomen. Passage of more gas than usual.  Walking can help get rid of the air that was put into your GI tract during the procedure and reduce the bloating. If you had a lower endoscopy (such as a colonoscopy or flexible sigmoidoscopy) you may notice spotting of blood in your stool or on the toilet paper. If you underwent a bowel prep for your procedure, you may not have a normal bowel movement for a few days.  Please Note:  You might notice some irritation and congestion in your nose or some drainage.  This is from the oxygen used during your procedure.  There is no need for concern and it should clear up in a day or so.  SYMPTOMS TO REPORT IMMEDIATELY:  Following upper endoscopy (EGD)  Vomiting of blood or coffee ground material  New chest pain or pain under the shoulder blades  Painful or persistently difficult swallowing  New shortness of breath  Fever of 100F or higher  Black, tarry-looking stools  For urgent or emergent issues, a gastroenterologist can be reached at any hour by calling [phone]. Do not use MyChart messaging for urgent concerns.    DIET:  We do recommend a  small meal at first, but then you may proceed to your regular diet.  Drink plenty of fluids but you should avoid alcoholic beverages for 24 hours.  ACTIVITY:  You should plan to take it easy for the rest of today and you should NOT DRIVE or use heavy machinery until tomorrow (because of the sedation medicines used during the test).    FOLLOW UP: Our staff will call the number listed on your records the next business day following your procedure.  We will call around 7:15- 8:00 am to check on you and address any questions or concerns that you may have regarding the information given to you following your procedure. If we do not reach you, we will leave a message.  If you develop any symptoms (ie: fever, flu-like symptoms, shortness of breath, cough etc.) before then, please call [phone].  If you test positive for Covid 19 in the 2 weeks post procedure, please call and report this information to [place].    If any biopsies were taken you will be contacted by phone or by letter within the next 1-3 weeks.  Please call us at [phone] if you have not heard about the biopsies in 3 weeks.    SIGNATURES/CONFIDENTIALITY: You and/or your care partner have signed paperwork which will be entered into your electronic medical record.  These signatures attest to the fact that that the information above on your After Visit Summary has been reviewed and is understood.  Full responsibility of the confidentiality of this discharge  information lies with you and/or your care-partner.

## 2022-03-10 ENCOUNTER — Telehealth: Payer: Self-pay | Admitting: *Deleted

## 2022-03-10 ENCOUNTER — Other Ambulatory Visit (HOSPITAL_BASED_OUTPATIENT_CLINIC_OR_DEPARTMENT_OTHER): Payer: Self-pay

## 2022-03-10 NOTE — Telephone Encounter (Signed)
Follow up attempt. No answer.

## 2022-03-16 ENCOUNTER — Other Ambulatory Visit (HOSPITAL_BASED_OUTPATIENT_CLINIC_OR_DEPARTMENT_OTHER): Payer: Self-pay

## 2022-03-17 ENCOUNTER — Telehealth: Payer: Self-pay | Admitting: Gastroenterology

## 2022-03-17 NOTE — Telephone Encounter (Signed)
Spoke with patient. Refer to result note 03/09/22.

## 2022-03-17 NOTE — Telephone Encounter (Signed)
Patient is returning your call.  

## 2022-03-20 ENCOUNTER — Encounter: Payer: 59 | Admitting: Gastroenterology

## 2022-04-08 ENCOUNTER — Ambulatory Visit: Payer: 59 | Admitting: Gastroenterology

## 2022-04-16 ENCOUNTER — Ambulatory Visit: Payer: 59 | Admitting: Gastroenterology

## 2022-04-16 ENCOUNTER — Encounter: Payer: Self-pay | Admitting: Gastroenterology

## 2022-04-16 VITALS — BP 126/68 | HR 66 | Ht 65.5 in | Wt 200.4 lb

## 2022-04-16 DIAGNOSIS — R1310 Dysphagia, unspecified: Secondary | ICD-10-CM | POA: Diagnosis not present

## 2022-04-16 DIAGNOSIS — K219 Gastro-esophageal reflux disease without esophagitis: Secondary | ICD-10-CM | POA: Diagnosis not present

## 2022-04-16 NOTE — Patient Instructions (Signed)
Continue your lansoprazole thru the end of October and then send [place] a message with an update of your symptoms.   _______________________________________________________  If you are age 56 or older, your body mass index should be between 23-30. Your Body mass index is 32.84 kg/m. If this is out of the aforementioned range listed, please consider follow up with your Primary Care Provider.  If you are age 32 or younger, your body mass index should be between 19-25. Your Body mass index is 32.84 kg/m. If this is out of the aformentioned range listed, please consider follow up with your Primary Care Provider.   ________________________________________________________  The [facility] providers would like to encourage you to use [facility] to communicate with providers for non-urgent requests or questions.  Due to long hold times on the telephone, sending your provider a message by [facility] may be a faster and more efficient way to get a response.  Please allow 48 business hours for a response.  Please remember that this is for non-urgent requests.  _______________________________________________________   I appreciate the opportunity to care for you. Alonza Bogus, PA-C

## 2022-04-16 NOTE — Progress Notes (Signed)
04/16/2022 Brittany Padilla [id number] [birth year]   HISTORY OF PRESENT ILLNESS: This is a 56 year old female who is a patient of Dr. Ardis Hughs.  She has a history of IBS with constipation predominance, personal history of colon polyps, chronic dysphagia.  She was seen by me on 02/19/2022.  Subsequently underwent EGD with Dr. Tarri Glenn as below for complaints of dysphagia and GERD.  No source of dysphagia was found, but she did empirically dilated the esophagus.  She says that really did not help.  She had started on Prevacid 15 mg daily, however, due to history of C. difficile she was wanting to be cautious about taking PPIs.  She tried Pepcid 20 mg twice daily, but that did not seem to help so she is now back on the Prevacid 15 mg daily.  That has been helping the reflux, but she still has the dysphagia.  EGD 02/2022 with Dr. Tarri Glenn:  - No endoscopic esophageal abnormality to explain patient's dysphagia. Esophagus dilated. Biopsied. - Erosive gastropathy with no bleeding and no stigmata of recent bleeding. Biopsied. - Normal examined duodenum. Biopsied. - The examination was otherwise normal.  1. Surgical [P], gastric antrum and gastric body - GASTRIC ANTRAL MUCOSA WITH NONSPECIFIC REACTIVE GASTROPATHY - GASTRIC OXYNTIC MUCOSA WITH PARIETAL CELL HYPERPLASIA AS CAN BE SEEN IN HYPERGASTRINEMIC STATES SUCH AS PPI THERAPY. - HELICOBACTER PYLORI-LIKE ORGANISMS ARE NOT IDENTIFIED ON ROUTINE H&E STAIN 2. Surgical [P], distal esophagus - ESOPHAGEAL SQUAMOUS MUCOSA WITH MILD VASCULAR CONGESTION, AND FOCAL SQUAMOUS BALLOONING, SUGGESTIVE OF REFLUX ESOPHAGITIS - NEGATIVE FOR INCREASED INTRAEPITHELIAL EOSINOPHILS 3. Surgical [P], mid/ proximal esophagus - ESOPHAGEAL SQUAMOUS MUCOSA WITH NO SPECIFIC HISTOPATHOLOGIC CHANGES - NEGATIVE FOR INCREASED INTRAEPITHELIAL EOSINOPHILS   Gastric emptying scan December 2011 was normal   Pathology results from colonoscopy and upper endoscopy October 2018:  Polyps from her colon were all hyperplastic.  GE junction showed GERD damage.  No eosinophilic esophagitis.  The duodenum was normal.  The stomach showed mild chronic gastritis without H. Pylori.   EGD report October 2018 indications GERD and dysphagia.  Findings "benign intrinsic 13 mm stricture was seen at the GE junction" it was dilated to 15 mm with a balloon.  Esophagitis was seen as well.  Biopsies were taken from a normal esophagus.   Colonoscopy report October 2018 described small 4 mm polyp in the sigmoid colon, diverticulosis in the left colon.  The examination was otherwise normal.   Blood work December 2010 celiac sprue panel was all negative including TTG and total IgA   EGD report November 2011 done for dysphagia and GERD described normal duodenum, hiatal hernia, erythema in the lower esophagus.  Otherwise normal examination.  Biopsies were taken from her stomach I believe.   Sits marker testing November 2011 showed "no remaining sits markers"   CT scan chest abdomen pelvis with IV contrast done for "dysphagia and right upper quadrant pain" showed status post cholecystectomy, status post appendectomy, the examination was otherwise normal   Blood work November 2020 showed completely normal complete metabolic profile, normal CBC normal hemoglobin A1c   Esophageal manometry report November 2011 was "normal"   Ambulatory pH monitoring test November 2011 showed "poor gastric acid suppression" also "positive symptom association with regurgitation of throat pain" this was done while on medicines at Nexium 40 mg once daily    Stool Hemoccult testing December 2011 was negative   There is an office note from believe her gastroenterologist dated April 2018 in this they alluded to a "recent  upper GI report where a Schatzki's ring in her distal esophagus was described"   Past Medical History:  Diagnosis Date   Anxiety    Clostridium difficile infection 2008   Colon polyps    Depression     Diverticulosis    GERD (gastroesophageal reflux disease)    Irritable bowel syndrome (IBS)    Migraines    Obesity    PVC (premature ventricular contraction)    when stressed   Sleep apnea    mild    Thyroid disease    2010 nodules only   Viral meningitis 1998   Past Surgical History:  Procedure Laterality Date   APPENDECTOMY  1998   CHOLECYSTECTOMY  2000   COLONOSCOPY     ESOPHAGOGASTRODUODENOSCOPY      reports that she has never smoked. She has never used smokeless tobacco. She reports current alcohol use. She reports that she does not use drugs. family history includes Breast cancer in her sister; Colonic polyp in her mother; Lung cancer in her father and mother; Sleep apnea in her mother and sister; Tremor in her sister. Allergies  Allergen Reactions   Ansaid [Flurbiprofen]     Prefers not too   Ibuprofen    Nsaids Other (See Comments) and Hives    Other reaction(s): Rash - Hives   Celecoxib Other (See Comments), Itching and Rash    Red itchy hands and feet and roof of mouth itched Other reaction(s): Other (See Comments), Rash - Hives Red itchy hands and feet and roof of mouth itched    Sertraline Hcl     Other reaction(s): Headaches      Outpatient Encounter Medications as of 04/16/2022  Medication Sig   cetirizine (ZYRTEC) 10 MG tablet one po qd. (Patient not taking: Reported on 04/16/2022)   cyclobenzaprine (FLEXERIL) 10 MG tablet Take 0.5-1 tablets (5-10 mg total) by mouth at bedtime as needed, and as tolerated can increase to 3 times daily as needed.   fexofenadine-pseudoephedrine (ALLEGRA-D 24) 180-240 MG 24 hr tablet Take 1 tablet by mouth daily. (Patient not taking: Reported on 04/16/2022)   lansoprazole (PREVACID) 15 MG capsule Take 1 capsule (15 mg total) by mouth daily at 2 PM.   levothyroxine (SYNTHROID) 75 MCG tablet TAKE 1 TABLET BY MOUTH EVERY MORNING ON AN EMPTY STOMACH   Probiotic Product (PROBIOTIC PO) Take by mouth. It is in her fiber pill    sertraline (ZOLOFT) 50 MG tablet TAKE 1 TABLET BY MOUTH ONCE DAILY IF NOT TOLERATED CAN TAKE 1/2 TABLET FOR 2 WEEKS   sertraline (ZOLOFT) 50 MG tablet Take 1 tablet (50 mg total) by mouth daily.   sucralfate (CARAFATE) 1 g tablet Take 1 tablet by mouth on an empty stomach 4 times daily   VITAMIN D PO Take by mouth daily. 10000 units   [DISCONTINUED] levothyroxine (SYNTHROID) 75 MCG tablet Take 1 tablet by mouth in the morning on an empty stomach  once a day   [DISCONTINUED] levothyroxine (SYNTHROID) 75 MCG tablet Take 1 tablet by mouth in the morning on an empty stomach  once a day   No facility-administered encounter medications on file as of 04/16/2022.     REVIEW OF SYSTEMS  : All other systems reviewed and negative except where noted in the History of Present Illness.   PHYSICAL EXAM: BP 126/68   Pulse 66   Ht 5' 5.5" (1.664 m)   Wt 200 lb 6 oz (90.9 kg)   BMI 32.84 kg/m  General: Well developed  white female in no acute distress Head: Normocephalic and atraumatic Eyes:  Sclerae anicteric, conjunctiva pink. Ears: Normal auditory acuity Lungs: Clear throughout to auscultation; no W/R/R. Heart: Regular rate and rhythm; no M/R/G. Musculoskeletal: Symmetrical with no gross deformities  Skin: No lesions on visible extremities Extremities: No edema  Neurological: Alert oriented x 4, grossly non-focal Psychological:  Alert and cooperative. Normal mood and affect  ASSESSMENT AND PLAN: *GERD and dysphagia: EGD did not show any source of dysphagia, but esophagus was empirically dilated.  Really no improvement in symptoms.  She was still having reflux on the Pepcid twice a day so she has gone back to Prevacid 15 mg daily for a little while.  Still having dysphagia.  We discussed continuing with an esophagram to evaluate real-time for dysmotility, etc.  Other option would be to repeat manometry.  Says she is going to continue the Prevacid for a while and see how she does.  She will send a  message back with an update on her symptoms in a few weeks and we can decide whether to proceed with esophagram if no improvement.   CC:  Sueanne Margarita, DO

## 2022-04-17 NOTE — Progress Notes (Signed)
Reviewed and agree with management plans. ? ?Brittany Covey L. [name], MD, MPH  ?

## 2022-05-15 ENCOUNTER — Other Ambulatory Visit (HOSPITAL_BASED_OUTPATIENT_CLINIC_OR_DEPARTMENT_OTHER): Payer: Self-pay

## 2022-05-15 ENCOUNTER — Other Ambulatory Visit: Payer: 59

## 2022-05-15 MED ORDER — FLUARIX QUADRIVALENT 0.5 ML IM SUSY
PREFILLED_SYRINGE | INTRAMUSCULAR | 0 refills | Status: AC
  Filled 2022-05-15: qty 0.5, 1d supply, fill #0

## 2022-05-21 ENCOUNTER — Other Ambulatory Visit (HOSPITAL_BASED_OUTPATIENT_CLINIC_OR_DEPARTMENT_OTHER): Payer: Self-pay

## 2022-05-21 MED ORDER — LEVOTHYROXINE SODIUM 75 MCG PO TABS
75.0000 ug | ORAL_TABLET | Freq: Every morning | ORAL | 3 refills | Status: DC
  Filled 2022-05-21: qty 90, 90d supply, fill #0
  Filled 2022-08-27: qty 90, 90d supply, fill #1
  Filled 2022-12-10: qty 90, 90d supply, fill #2
  Filled 2023-03-15: qty 90, 90d supply, fill #3

## 2022-05-25 ENCOUNTER — Other Ambulatory Visit (HOSPITAL_BASED_OUTPATIENT_CLINIC_OR_DEPARTMENT_OTHER): Payer: Self-pay

## 2022-05-26 ENCOUNTER — Other Ambulatory Visit (HOSPITAL_BASED_OUTPATIENT_CLINIC_OR_DEPARTMENT_OTHER): Payer: Self-pay

## 2022-05-29 ENCOUNTER — Ambulatory Visit
Admission: RE | Admit: 2022-05-29 | Discharge: 2022-05-29 | Disposition: A | Discharge status: Home / Self Care | Payer: No Typology Code available for payment source | Source: Ambulatory Visit | Attending: Internal Medicine | Admitting: Internal Medicine

## 2022-05-29 DIAGNOSIS — Z8249 Family history of ischemic heart disease and other diseases of the circulatory system: Secondary | ICD-10-CM

## 2022-06-02 ENCOUNTER — Other Ambulatory Visit (HOSPITAL_BASED_OUTPATIENT_CLINIC_OR_DEPARTMENT_OTHER): Payer: Self-pay

## 2022-06-02 MED ORDER — SERTRALINE HCL 50 MG PO TABS
50.0000 mg | ORAL_TABLET | Freq: Every day | ORAL | 3 refills | Status: DC
  Filled 2022-06-02: qty 90, 90d supply, fill #0
  Filled 2022-08-27: qty 90, 90d supply, fill #1
  Filled 2022-12-10: qty 90, 90d supply, fill #2
  Filled 2023-03-15: qty 90, 90d supply, fill #3

## 2022-06-03 ENCOUNTER — Other Ambulatory Visit (HOSPITAL_BASED_OUTPATIENT_CLINIC_OR_DEPARTMENT_OTHER): Payer: Self-pay

## 2022-07-20 ENCOUNTER — Other Ambulatory Visit: Payer: Self-pay

## 2022-07-20 ENCOUNTER — Emergency Department (HOSPITAL_BASED_OUTPATIENT_CLINIC_OR_DEPARTMENT_OTHER): Payer: No Typology Code available for payment source

## 2022-07-20 ENCOUNTER — Emergency Department (HOSPITAL_BASED_OUTPATIENT_CLINIC_OR_DEPARTMENT_OTHER)
Admission: EM | Admit: 2022-07-20 | Discharge: 2022-07-20 | Disposition: A | Discharge status: Home / Self Care | Payer: No Typology Code available for payment source | Attending: Emergency Medicine | Admitting: Emergency Medicine

## 2022-07-20 ENCOUNTER — Encounter (HOSPITAL_BASED_OUTPATIENT_CLINIC_OR_DEPARTMENT_OTHER): Payer: Self-pay | Admitting: Pediatrics

## 2022-07-20 DIAGNOSIS — S8264XA Nondisplaced fracture of lateral malleolus of right fibula, initial encounter for closed fracture: Secondary | ICD-10-CM | POA: Diagnosis not present

## 2022-07-20 DIAGNOSIS — W108XXA Fall (on) (from) other stairs and steps, initial encounter: Secondary | ICD-10-CM | POA: Insufficient documentation

## 2022-07-20 DIAGNOSIS — M25571 Pain in right ankle and joints of right foot: Secondary | ICD-10-CM | POA: Diagnosis not present

## 2022-07-20 NOTE — ED Provider Notes (Signed)
[facility] EMERGENCY DEPARTMENT Provider Note   CSN: [id number] Arrival date & time: 07/20/22  1002     History  Chief Complaint  Patient presents with   Ankle Pain    Brittany Padilla is a 57 y.o. female.  57 year old female presents today for evaluation of right ankle pain.  She states she fell yesterday and has she was coming down a set of stairs.  She missed the last step falling onto her knees and hands.  She states she heard a pop.  Denies head injury, loss of consciousness.  Denies any other injury.  She states she has been taking Aleve.  However the swelling has not improved so she wanted to come in to be evaluated.  She has been able to ambulate on this foot without too much difficulty.  The history is provided by the patient. No language interpreter was used.       Home Medications Prior to Admission medications   Medication Sig Start Date End Date Taking? Authorizing Provider  cetirizine (ZYRTEC) 10 MG tablet one po qd. Patient not taking: Reported on 04/16/2022 06/13/19   [provider]  cyclobenzaprine (FLEXERIL) 10 MG tablet Take 0.5-1 tablets (5-10 mg total) by mouth at bedtime as needed, and as tolerated can increase to 3 times daily as needed. 12/08/21     fexofenadine-pseudoephedrine (ALLEGRA-D 24) 180-240 MG 24 hr tablet Take 1 tablet by mouth daily. Patient not taking: Reported on 04/16/2022    [provider]  influenza vac split quadrivalent PF (FLUARIX QUADRIVALENT) 0.5 ML injection Inject into the muscle. 05/15/22   Carlyle Basques, MD  lansoprazole (PREVACID) 15 MG capsule Take 1 capsule (15 mg total) by mouth daily at 2 PM. 02/19/22   Zehr, Laban Emperor, PA-C  levothyroxine (SYNTHROID) 75 MCG tablet TAKE 1 TABLET BY MOUTH EVERY MORNING ON AN EMPTY STOMACH 05/31/20 06/08/21  Sueanne Margarita, DO  levothyroxine (SYNTHROID) 75 MCG tablet Take 1 tablet (75 mcg total) by mouth in the morning on an empty stomach 05/21/22     Probiotic Product  (PROBIOTIC PO) Take by mouth. It is in her fiber pill    [provider]  sertraline (ZOLOFT) 50 MG tablet TAKE 1 TABLET BY MOUTH ONCE DAILY IF NOT TOLERATED CAN TAKE 1/2 TABLET FOR 2 WEEKS 10/29/20 10/29/21  Sueanne Margarita, DO  sertraline (ZOLOFT) 50 MG tablet Take 1 tablet (50 mg total) by mouth daily. 06/02/22     sucralfate (CARAFATE) 1 g tablet Take 1 tablet by mouth on an empty stomach 4 times daily 04/07/21     VITAMIN D PO Take by mouth daily. 10000 units    [provider]      Allergies    Ansaid [flurbiprofen], Ibuprofen, Nsaids, Celecoxib, and Sertraline hcl    Review of Systems   Review of Systems  Musculoskeletal:  Positive for arthralgias and joint swelling.  All other systems reviewed and are negative.   Physical Exam Updated Vital Signs BP (!) 146/91 (BP Location: Left Arm)   Pulse 89   Temp 98.3 F (36.8 C) (Oral)   Resp 18   Ht 5' 3.5" (1.613 m)   Wt 90.7 kg   SpO2 97%   BMI 34.87 kg/m  Physical Exam Vitals and nursing note reviewed.  Constitutional:      General: She is not in acute distress.    Appearance: Normal appearance. She is not ill-appearing.  HENT:     Head: Normocephalic and atraumatic.  Nose: Nose normal.  Eyes:     Conjunctiva/sclera: Conjunctivae normal.  Pulmonary:     Effort: Pulmonary effort is normal. No respiratory distress.  Musculoskeletal:        General: No deformity.     Comments: 2+ DP pulse present.  Mild joint swelling noted particularly over the lateral aspect of the right ankle.  Brisk cap refill.  Proximal tib-fib without tenderness to palpation.  Good range of motion in the right knee without tenderness to palpation.  Preserved range of motion in the right ankle.  Skin:    Findings: No rash.  Neurological:     Mental Status: She is alert.     ED Results / Procedures / Treatments   Labs (all labs ordered are listed, but only abnormal results are displayed) Labs Reviewed - No data to  display  EKG None  Radiology DG Ankle Complete Right  Result Date: 07/20/2022 CLINICAL DATA:  Acute RIGHT ankle pain following injury yesterday. Initial encounter. EXAM: RIGHT ANKLE - COMPLETE 3+ VIEW COMPARISON:  None Available. FINDINGS: A nondisplaced fracture of the fibular tip is noted. Overlying soft tissue swelling is present. No other fracture, subluxation or dislocation identified. A moderate calcaneal plantar spur is noted. IMPRESSION: Nondisplaced fibular tip fracture. Electronically Signed   By: Margarette Canada M.D.   On: 07/20/2022 10:45    Procedures Procedures    Medications Ordered in ED Medications - No data to display  ED Course/ Medical Decision Making/ A&P                           Medical Decision Making Amount and/or Complexity of Data Reviewed Radiology: ordered.   57 year old female presents today for evaluation of right ankle injury.  This occurred when patient took a fall yesterday.  X-ray demonstrates fracture of the tip of the right fibula.  Nondisplaced.  Neurovascularly intact.  Will provide walking boot.  Weightbearing as tolerated.  Return precaution discussed.  Sports medicine referral given.  Symptomatic management including rest, elevation, and ice therapy discussed.  Discussed continuing to take Aleve.  Final Clinical Impression(s) / ED Diagnoses Final diagnoses:  Closed nondisplaced fracture of lateral malleolus of right fibula, initial encounter    Rx / DC Orders ED Discharge Orders     None         Evlyn Courier, PA-C 07/20/22 1356    Gareth Morgan, MD 07/21/22 1422

## 2022-07-20 NOTE — Discharge Instructions (Addendum)
You have a nondisplaced fracture of your fibula bone in the right ankle.  We have placed you in a walking boot.  You can continue to bear weight as you can tolerate.  Continue taking Aleve over the next 5 days for anti-inflammatory effect.  Ice the ankle for 15 minutes every 3-4 hours.  I provided you with sports medicine follow-up.  For any concerning symptoms return to the emergency room.

## 2022-07-20 NOTE — ED Notes (Signed)
Pt discharged to home. Discharge instructions have been discussed with patient and/or family members. Pt verbally acknowledges understanding d/c instructions, and endorses comprehension to checkout at registration before leaving.  °

## 2022-07-20 NOTE — ED Triage Notes (Signed)
Reported stepped down on the last step and rolled her right ankle and heard a pop; denies head injury.

## 2022-07-21 ENCOUNTER — Ambulatory Visit (INDEPENDENT_AMBULATORY_CARE_PROVIDER_SITE_OTHER): Payer: Commercial Managed Care - PPO | Admitting: Family Medicine

## 2022-07-21 ENCOUNTER — Encounter: Payer: Self-pay | Admitting: Family Medicine

## 2022-07-21 VITALS — BP 130/88 | Ht 63.5 in | Wt 200.0 lb

## 2022-07-21 DIAGNOSIS — S82839A Other fracture of upper and lower end of unspecified fibula, initial encounter for closed fracture: Secondary | ICD-10-CM | POA: Diagnosis not present

## 2022-07-21 NOTE — Patient Instructions (Signed)
Nice to meet you Please try ice  Please try range of motion movements  Please continue the boot   Please send me a message in MyChart with any questions or updates.  Please see me back in 3 weeks.   --Dr. Raeford Razor

## 2022-07-21 NOTE — Progress Notes (Signed)
  Brittany Padilla - 57 y.o. female MRN [id number]  Date of birth: [birth year]  SUBJECTIVE:  Including CC & ROS.  No chief complaint on file.   Brittany Padilla is a 57 y.o. female that is presenting with right ankle pain.  She had an inversion injury on 12/31.  Today having bruising and swelling over the hindfoot.  Was found to have a fracture in the emergency department and placed in a cam walker..  Review of the emergency department note from 1/1 shows she was counseled on aleve.  Independent review of the right ankle x-ray from 1/1 shows avulsion fracture off of the distal tip of the fibula  Review of Systems See HPI   HISTORY: Past Medical, Surgical, Social, and Family History Reviewed & Updated per EMR.   Pertinent Historical Findings include:  Past Medical History:  Diagnosis Date   Anxiety    Clostridium difficile infection 2008   Colon polyps    Depression    Diverticulosis    GERD (gastroesophageal reflux disease)    Irritable bowel syndrome (IBS)    Migraines    Obesity    PVC (premature ventricular contraction)    when stressed   Sleep apnea    mild    Thyroid disease    2010 nodules only   Viral meningitis 1998    Past Surgical History:  Procedure Laterality Date   APPENDECTOMY  1998   CHOLECYSTECTOMY  2000   COLONOSCOPY     ESOPHAGOGASTRODUODENOSCOPY       PHYSICAL EXAM:  VS: BP 130/88 (BP Location: Left Arm, Patient Position: Sitting)   Ht 5' 3.5" (1.613 m)   Wt 200 lb (90.7 kg)   BMI 34.87 kg/m  Physical Exam Gen: NAD, alert, cooperative with exam, well-appearing MSK:  Neurovascularly intact       ASSESSMENT & PLAN:   Avulsion fracture of distal end of fibula Acutely occurring.  Initial injury on 12/31.  Fracture appreciated on imaging with swelling and bruising today. -Counseled on home exercise therapy and supportive care. -Counseled on CAM Walker. -Follow-up in 3 weeks.

## 2022-07-21 NOTE — Assessment & Plan Note (Signed)
Acutely occurring.  Initial injury on 12/31.  Fracture appreciated on imaging with swelling and bruising today. -Counseled on home exercise therapy and supportive care. -Counseled on CAM Walker. -Follow-up in 3 weeks.

## 2022-08-11 ENCOUNTER — Ambulatory Visit: Payer: Self-pay

## 2022-08-11 ENCOUNTER — Ambulatory Visit: Payer: Commercial Managed Care - PPO | Admitting: Family Medicine

## 2022-08-11 ENCOUNTER — Encounter: Payer: Self-pay | Admitting: Family Medicine

## 2022-08-11 VITALS — BP 128/84 | Ht 63.05 in | Wt 200.0 lb

## 2022-08-11 DIAGNOSIS — S82839A Other fracture of upper and lower end of unspecified fibula, initial encounter for closed fracture: Secondary | ICD-10-CM | POA: Diagnosis not present

## 2022-08-11 NOTE — Progress Notes (Signed)
  Brittany Padilla - 57 y.o. female MRN [id number]  Date of birth: 13-May-1966  SUBJECTIVE:  Including CC & ROS.  No chief complaint on file.   Brittany Padilla is a 57 y.o. female that is following up for her avulsion fracture of the right ankle.  She is doing well with the cam walker.  She does continue to have swelling.    Review of Systems See HPI   HISTORY: Past Medical, Surgical, Social, and Family History Reviewed & Updated per EMR.   Pertinent Historical Findings include:  Past Medical History:  Diagnosis Date   Anxiety    Clostridium difficile infection 2008   Colon polyps    Depression    Diverticulosis    GERD (gastroesophageal reflux disease)    Irritable bowel syndrome (IBS)    Migraines    Obesity    PVC (premature ventricular contraction)    when stressed   Sleep apnea    mild    Thyroid disease    2010 nodules only   Viral meningitis 1998    Past Surgical History:  Procedure Laterality Date   APPENDECTOMY  1998   CHOLECYSTECTOMY  2000   COLONOSCOPY     ESOPHAGOGASTRODUODENOSCOPY       PHYSICAL EXAM:  VS: BP 128/84 (BP Location: Left Arm, Patient Position: Sitting)   Ht 5' 3.05" (1.601 m)   Wt 200 lb (90.7 kg)   BMI 35.37 kg/m  Physical Exam Gen: NAD, alert, cooperative with exam, well-appearing MSK:  Neurovascularly intact    Limited ultrasound: Right ankle pain:  Normal-appearing Achilles tendon. Effusion noted within the ankle joint Stable appearance of the avulsion fracture  No changes of the base of the fifth metatarsal  Summary: Findings consistent with ankle effusion and avulsion fracture  Ultrasound and interpretation by Clearance Coots, MD    ASSESSMENT & PLAN:   Avulsion fracture of distal end of fibula Doing well.  Initial injury on 12/31.  Continues to have swelling but motion is improving.  She does have an ankle effusion appreciated -Counseled on home exercise therapy and supportive care. -Transition from cam walker to  stirrup brace. -Could consider physical therapy or injection

## 2022-08-11 NOTE — Patient Instructions (Signed)
Good to see you Please try the stirrup brace. You can wean out of this as you tolerate  Please continue to ice  Please try the exercises   Please send me a message in MyChart with any questions or updates.  Please see me back in 3-4 weeks or as needed if better.   --Dr. Raeford Razor

## 2022-08-11 NOTE — Assessment & Plan Note (Signed)
Doing well.  Initial injury on 12/31.  Continues to have swelling but motion is improving.  She does have an ankle effusion appreciated -Counseled on home exercise therapy and supportive care. -Transition from cam walker to stirrup brace. -Could consider physical therapy or injection

## 2022-08-20 ENCOUNTER — Ambulatory Visit: Payer: 59 | Admitting: Adult Health

## 2022-08-27 ENCOUNTER — Other Ambulatory Visit: Payer: Self-pay

## 2022-08-28 ENCOUNTER — Other Ambulatory Visit: Payer: Self-pay

## 2022-08-31 ENCOUNTER — Other Ambulatory Visit (HOSPITAL_COMMUNITY): Payer: Self-pay

## 2022-09-04 DIAGNOSIS — R1319 Other dysphagia: Secondary | ICD-10-CM | POA: Diagnosis not present

## 2022-09-04 DIAGNOSIS — E559 Vitamin D deficiency, unspecified: Secondary | ICD-10-CM | POA: Diagnosis not present

## 2022-09-04 DIAGNOSIS — J351 Hypertrophy of tonsils: Secondary | ICD-10-CM | POA: Diagnosis not present

## 2022-09-04 DIAGNOSIS — M2170 Unequal limb length (acquired), unspecified site: Secondary | ICD-10-CM | POA: Diagnosis not present

## 2022-09-04 DIAGNOSIS — H532 Diplopia: Secondary | ICD-10-CM | POA: Diagnosis not present

## 2022-09-04 DIAGNOSIS — F325 Major depressive disorder, single episode, in full remission: Secondary | ICD-10-CM | POA: Diagnosis not present

## 2022-09-04 DIAGNOSIS — E039 Hypothyroidism, unspecified: Secondary | ICD-10-CM | POA: Diagnosis not present

## 2022-09-04 DIAGNOSIS — E041 Nontoxic single thyroid nodule: Secondary | ICD-10-CM | POA: Diagnosis not present

## 2022-09-04 DIAGNOSIS — G4733 Obstructive sleep apnea (adult) (pediatric): Secondary | ICD-10-CM | POA: Diagnosis not present

## 2022-09-08 ENCOUNTER — Other Ambulatory Visit: Payer: Self-pay | Admitting: Internal Medicine

## 2022-09-08 DIAGNOSIS — E041 Nontoxic single thyroid nodule: Secondary | ICD-10-CM

## 2022-09-10 ENCOUNTER — Ambulatory Visit: Payer: Commercial Managed Care - PPO | Admitting: Family Medicine

## 2022-09-17 DIAGNOSIS — Z1231 Encounter for screening mammogram for malignant neoplasm of breast: Secondary | ICD-10-CM | POA: Diagnosis not present

## 2022-09-17 DIAGNOSIS — Z01419 Encounter for gynecological examination (general) (routine) without abnormal findings: Secondary | ICD-10-CM | POA: Diagnosis not present

## 2022-09-17 DIAGNOSIS — Z6835 Body mass index (BMI) 35.0-35.9, adult: Secondary | ICD-10-CM | POA: Diagnosis not present

## 2022-09-30 ENCOUNTER — Other Ambulatory Visit (HOSPITAL_BASED_OUTPATIENT_CLINIC_OR_DEPARTMENT_OTHER): Payer: Self-pay

## 2022-09-30 MED ORDER — ESTRADIOL 0.5 MG/0.5GM TD GEL
1.0000 | Freq: Every day | TRANSDERMAL | 3 refills | Status: DC
  Filled 2022-09-30: qty 90, 90d supply, fill #0
  Filled 2022-12-30: qty 90, 90d supply, fill #1
  Filled 2023-04-01: qty 90, 90d supply, fill #2

## 2022-10-01 ENCOUNTER — Other Ambulatory Visit (HOSPITAL_BASED_OUTPATIENT_CLINIC_OR_DEPARTMENT_OTHER): Payer: Self-pay

## 2022-10-01 MED ORDER — PROGESTERONE MICRONIZED 100 MG PO CAPS
100.0000 mg | ORAL_CAPSULE | Freq: Every day | ORAL | 3 refills | Status: DC
  Filled 2022-10-01: qty 90, 90d supply, fill #0
  Filled 2022-12-30: qty 90, 90d supply, fill #1
  Filled 2023-04-01: qty 90, 90d supply, fill #2
  Filled 2023-06-27: qty 90, 90d supply, fill #3
  Filled 2023-06-28: qty 90, 90d supply, fill #0

## 2022-10-06 ENCOUNTER — Ambulatory Visit
Admission: RE | Admit: 2022-10-06 | Discharge: 2022-10-06 | Disposition: A | Discharge status: Home / Self Care | Payer: Commercial Managed Care - PPO | Source: Ambulatory Visit | Attending: Internal Medicine | Admitting: Internal Medicine

## 2022-10-06 DIAGNOSIS — E041 Nontoxic single thyroid nodule: Secondary | ICD-10-CM | POA: Diagnosis not present

## 2022-11-02 ENCOUNTER — Encounter: Payer: Self-pay | Admitting: *Deleted

## 2022-11-13 ENCOUNTER — Other Ambulatory Visit (HOSPITAL_COMMUNITY): Payer: Self-pay

## 2022-11-14 ENCOUNTER — Other Ambulatory Visit (HOSPITAL_COMMUNITY): Payer: Self-pay

## 2022-12-10 ENCOUNTER — Other Ambulatory Visit (HOSPITAL_COMMUNITY): Payer: Self-pay

## 2022-12-18 IMAGING — MR MR MRA HEAD W/O CM
1 series · 20 of 48 positions shown · non-contrast
Comparison: No pertinent prior exam.

CLINICAL DATA: New visual changes.

EXAM:
MRA HEAD WITHOUT CONTRAST
TECHNIQUE: Angiographic images of the Circle of Willis were acquired using MRA
technique without intravenous contrast.

[Series 8: TOF · axial · 0.6mm · 0.35mm/px · z∈[-36,+62]mm · 20 of 172 slices shown]
[im 1/172]
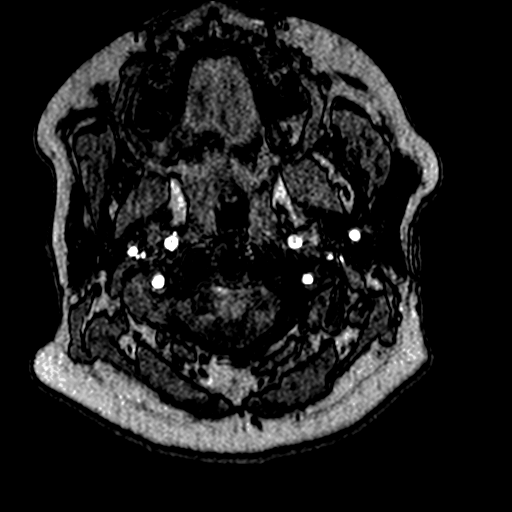
[im 4/172]
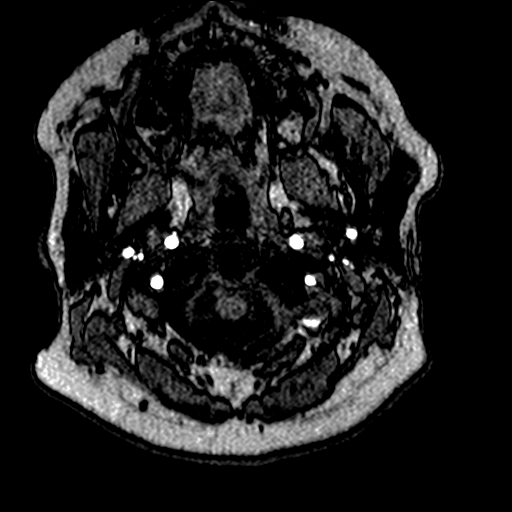
[im 8/172]
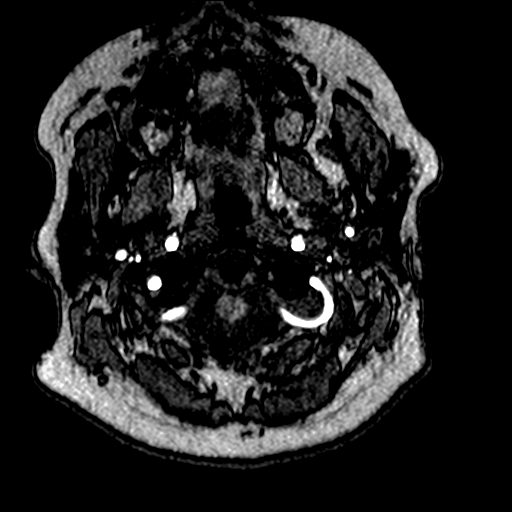
[im 11/172]
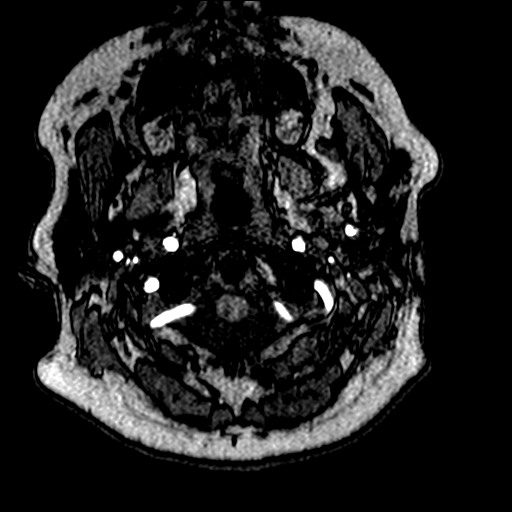
[im 15/172]
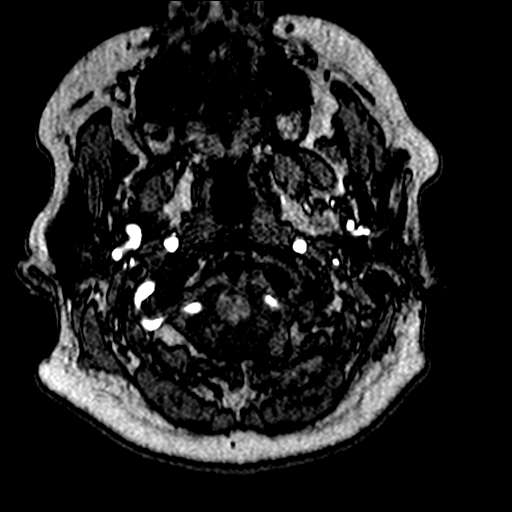
[im 19/172]
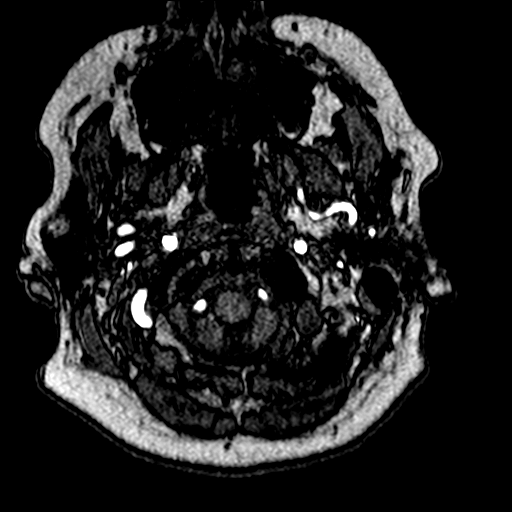
[im 22/172]
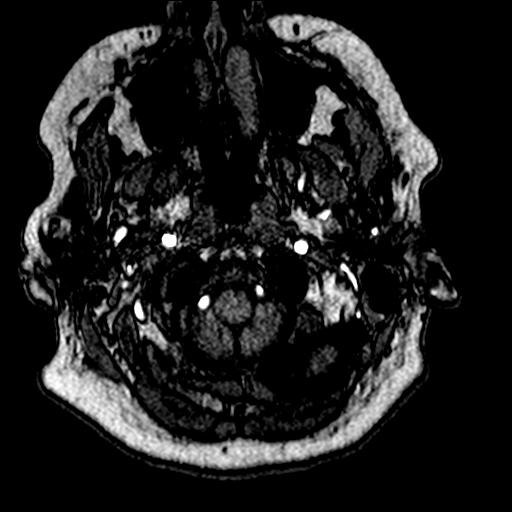
[im 26/172]
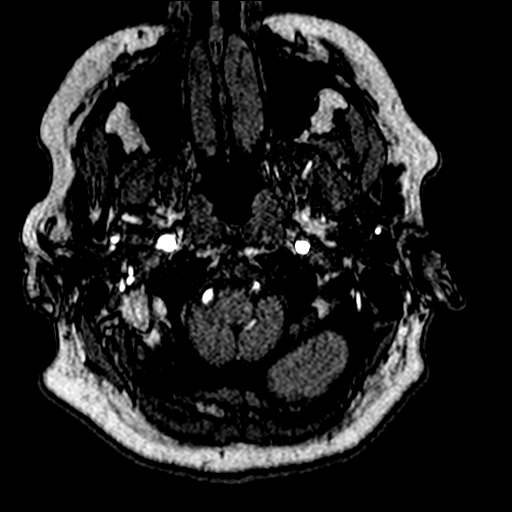
[im 30/172]
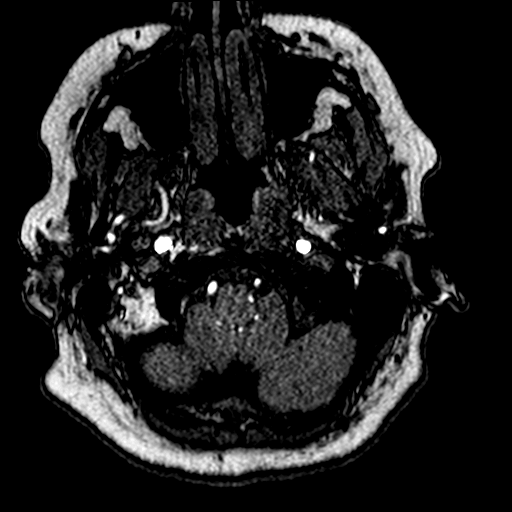
[im 33/172]
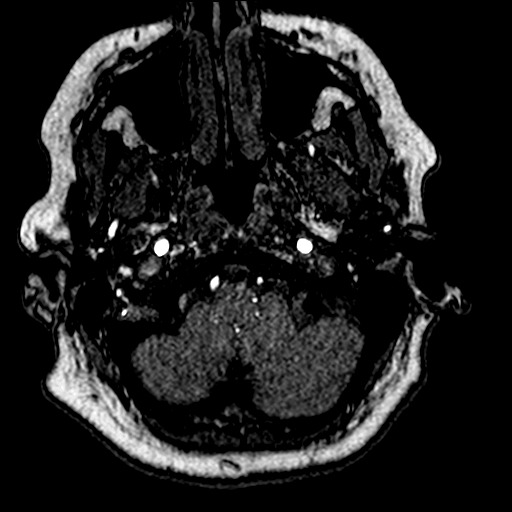
[im 37/172]
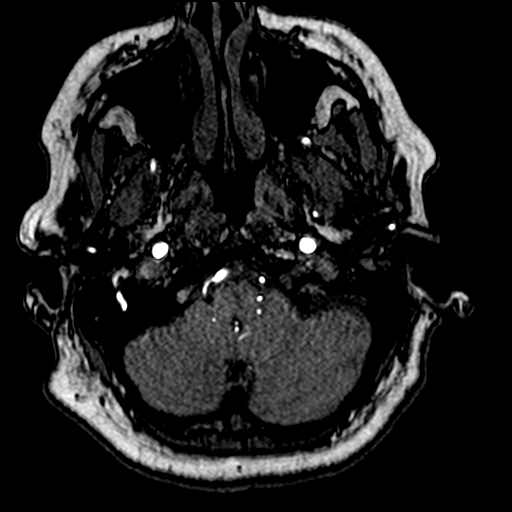
[im 41/172]
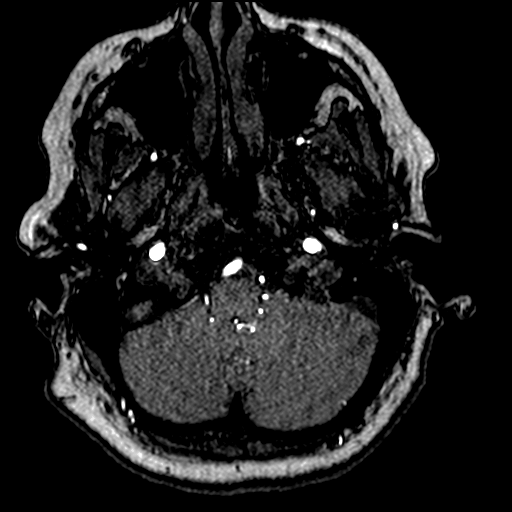
[im 55/172]
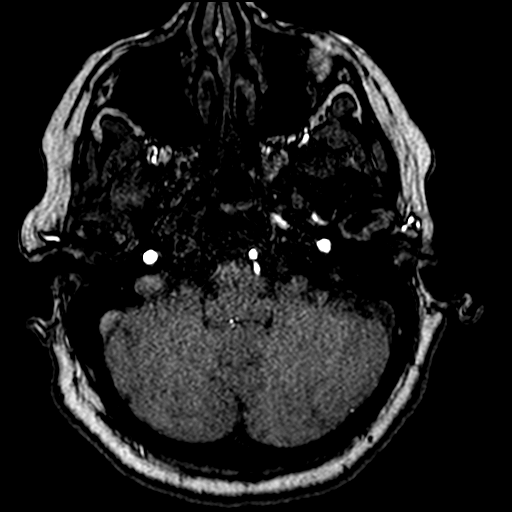
[im 77/172]
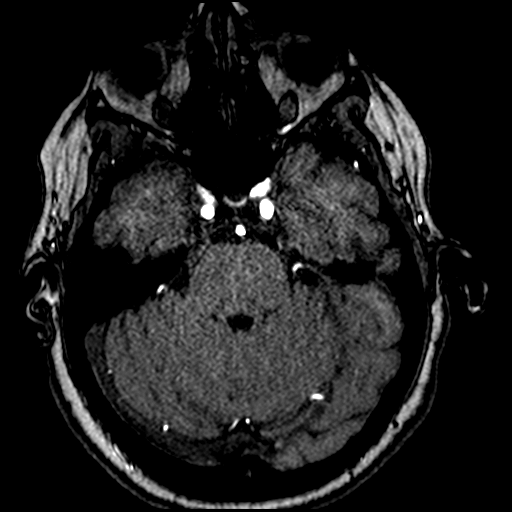
[im 88/172]
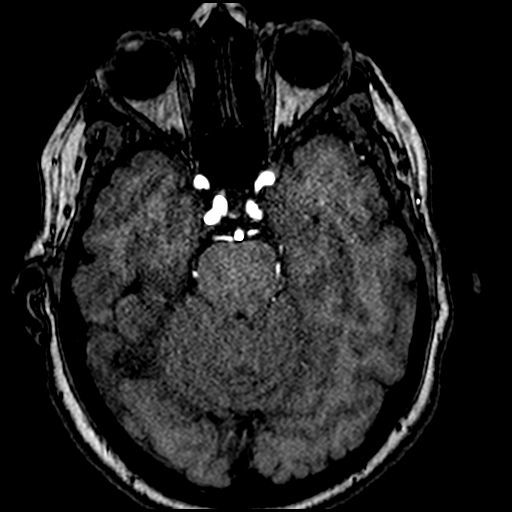
[im 99/172]
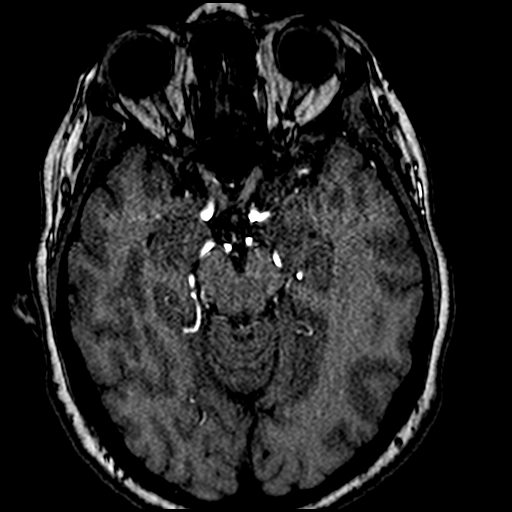
[im 121/172]
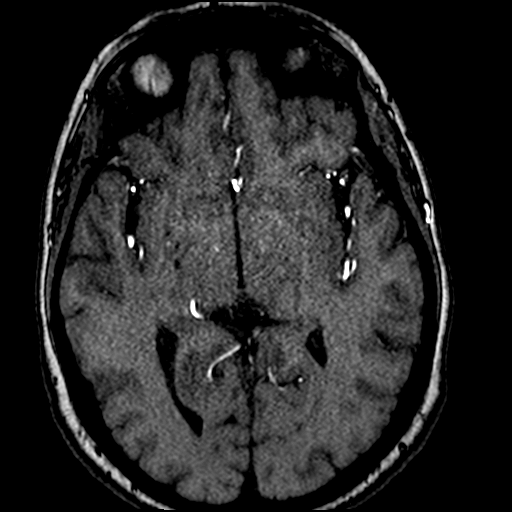
[im 142/172]
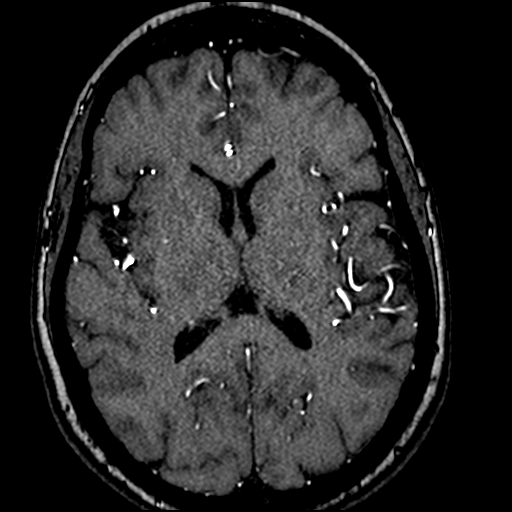
[im 146/172]
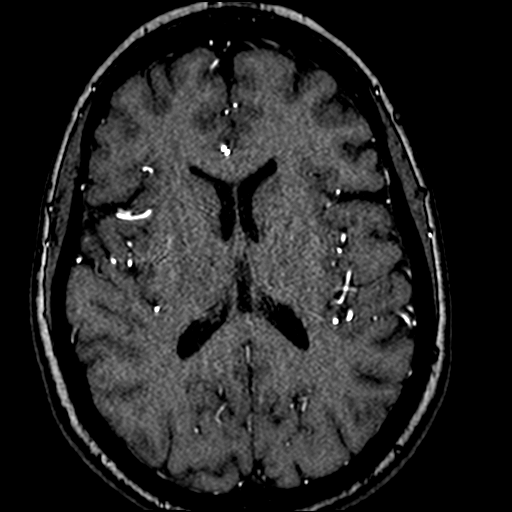
[im 164/172]
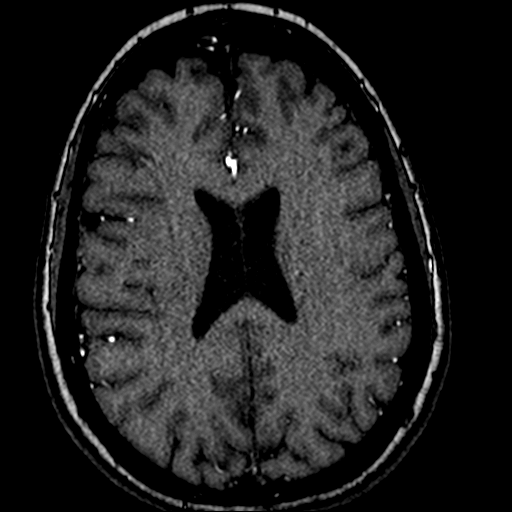

[20 of 48 positions shown; findings below may reference images not displayed]

FINDINGS: POSTERIOR CIRCULATION:

--Vertebral arteries: Normal

--Inferior cerebellar arteries: Normal.

--Basilar artery: Normal.

--Superior cerebellar arteries: Normal.

--Posterior cerebral arteries: Normal.

ANTERIOR CIRCULATION:

--Intracranial internal carotid arteries: Normal.

--Anterior cerebral arteries (ACA): Normal.

--Middle cerebral arteries (MCA): Normal.

ANATOMIC VARIANTS: None
IMPRESSION: Normal intracranial MRA.

## 2022-12-30 ENCOUNTER — Other Ambulatory Visit (HOSPITAL_COMMUNITY): Payer: Self-pay

## 2022-12-31 ENCOUNTER — Other Ambulatory Visit: Payer: Self-pay

## 2022-12-31 ENCOUNTER — Other Ambulatory Visit (HOSPITAL_COMMUNITY): Payer: Self-pay

## 2023-01-01 ENCOUNTER — Other Ambulatory Visit (HOSPITAL_COMMUNITY): Payer: Self-pay

## 2023-01-20 DIAGNOSIS — H532 Diplopia: Secondary | ICD-10-CM | POA: Diagnosis not present

## 2023-01-20 DIAGNOSIS — H40013 Open angle with borderline findings, low risk, bilateral: Secondary | ICD-10-CM | POA: Diagnosis not present

## 2023-01-20 DIAGNOSIS — H2513 Age-related nuclear cataract, bilateral: Secondary | ICD-10-CM | POA: Diagnosis not present

## 2023-01-20 DIAGNOSIS — H5051 Esophoria: Secondary | ICD-10-CM | POA: Diagnosis not present

## 2023-02-04 ENCOUNTER — Other Ambulatory Visit: Payer: Self-pay

## 2023-02-04 ENCOUNTER — Other Ambulatory Visit (HOSPITAL_COMMUNITY): Payer: Self-pay

## 2023-02-04 ENCOUNTER — Other Ambulatory Visit: Payer: Self-pay | Admitting: Gastroenterology

## 2023-02-04 MED ORDER — LANSOPRAZOLE 15 MG PO CPDR
15.0000 mg | DELAYED_RELEASE_CAPSULE | Freq: Every day | ORAL | 3 refills | Status: DC
  Filled 2023-02-04: qty 30, 30d supply, fill #0
  Filled 2023-03-15: qty 30, 30d supply, fill #1
  Filled 2023-04-19: qty 30, 30d supply, fill #2
  Filled 2023-05-21: qty 30, 30d supply, fill #3

## 2023-02-22 ENCOUNTER — Ambulatory Visit: Payer: Commercial Managed Care - PPO | Admitting: Adult Health

## 2023-02-22 ENCOUNTER — Encounter: Payer: Self-pay | Admitting: Adult Health

## 2023-02-22 VITALS — BP 128/83 | HR 67 | Ht 63.0 in | Wt 212.2 lb

## 2023-02-22 DIAGNOSIS — G4733 Obstructive sleep apnea (adult) (pediatric): Secondary | ICD-10-CM

## 2023-02-22 NOTE — Progress Notes (Signed)
PATIENT: Brittany Padilla DOB: 05/02/1966  REASON FOR VISIT: follow up HISTORY FROM: patient PRIMARY NEUROLOGIST: Dr. Terrace Arabia Sleep Neurologist: Dr. Frances Furbish  Chief Complaint  Patient presents with   Follow-up    Pt in 5 Pt here for CPAP f/u Pt states not sleeping well Pt states wakes up 3 times a night Pt states at times she has nose bleed      HISTORY OF PRESENT ILLNESS: Today 02/22/23:  Brittany Padilla is a 57 y.o. female with a history of OSA on CPAP. Returns today for follow-up.  She reports that she still struggles to use the CPAP.  She states that if she uses it consistently she will get nosebleeds.  She tends to wake up frequently when using the CPAP.  She states that when she wakes up she finds it hard to go back to sleep.  Her original sleep study was done when she was in [place].  It showed mild sleep apnea.  She states that she is now on HRT and has slept well-without using the CPAP.  She returns today for an evaluation.     08/20/21: Brittany Padilla is a 57 year old female with a history of obstructive sleep apnea on CPAP.  She reports that she has been having a hard time using the CPAP due to nosebleeds.  She also feels that her humidification is not working.  Her download indicates that she use her machine 20 out of the last 30 days for compliance of 66%.  On average she will use her machine 2 hours and 14 minutes.  Her residual AHI is 2.5 on 4 to 6 cm of water.  Advised patient that a mask refitting may be beneficial as well as having her DME company look at her CPAP machine.  Patient also initially saw Dr. Anne Hahn and then Dr. Loreta Ave for double vision.  Her work-up with [place] has remained relatively unremarkable.  She has been seeing an optometrist but not an ophthalmologist.  Reports that since she got her latest COVID booster.  She does feel better but still has days that she has double vision.  Tends to notice it more when she is in the car or moving.  Mainly affects only the left  eye.  Did a trial on Mestinon with Dr. Anne Hahn but did not notice any benefit.  Returns today for an evaluation.  HISTORY (copied from Dr. Teofilo Pod note)  Brittany Padilla is a 57 year old right-handed woman with an underlying medical history of allergies, reflux disease, hypothyroidism, migraines, diplopia (recently seen by my colleague, Dr. Anne Hahn), irritable bowel syndrome, history of diverticulitis, and obesity, who was previously diagnosed with obstructive sleep apnea and was placed on PAP therapy.  Sleep testing was in March 2019.  I was able to review her home sleep test report from 09/30/2017.  Study was done in [place] at the time.  Her overall AHI was 8/h, O2 nadir 87.7%.  She was started on AutoPap therapy.  She has a Sports administrator.  She has registered her machine back in June as she was notified of the recall on the [employer].  I was able to review a compliance report as well.  She recently had some weight increase.  She is working on lifestyle modification, trying to exercise more.  She has been trying to get back on her CPAP machine.  It looks like she has been on an AutoPap machine, 90th percentile of pressure at 5.8 cm, average AHI 2.6, compliance data was  reviewed from 01/18/2021 through 02/16/2021.  Average usage was 2 hours and 49 minutes, leak on the higher side. Her biggest issue is trying to stay asleep.  She has tried over-the-counter medication including Benadryl, melatonin about 5 mg, and other supplements, natural remedies.  Melatonin gave her headache.  She has had good success in reducing her double vision with prism eyeglasses.  She goes to bed between 930 and 10 and rise time is generally between 5:15 AM and 5:45 AM.  She has woken up occasionally with a headache and has nocturia about once or twice per average night.  She is divorced and lives alone, 1 cat and 2 dogs in the household.  Sometimes her pets wake her up in the middle of the night, most of the time it is no  apparent reason.  It is hard for her to maintain treatment with her AutoPap machine once she is up in the middle of the night.  She limits her caffeine to 2 cups of coffee in the morning, drinks alcohol rarely, is a non-smoker.  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  She does not have a local DME company yet.  She has been using nasal pillows and has extra supplies.  Of note, she has been on sertraline, has been on an antidepressant off and on for years.  She is currently taking the sertraline in the mornings.  REVIEW OF SYSTEMS: Out of a complete 14 system review of symptoms, the patient complains only of the following symptoms, and all other reviewed systems are negative.  ESS 2  ALLERGIES: Allergies  Allergen Reactions   Ansaid [Flurbiprofen]     Prefers not too   Ibuprofen    Nsaids Other (See Comments) and Hives    Other reaction(s): Rash - Hives   Celecoxib Other (See Comments), Itching and Rash    Red itchy hands and feet and roof of mouth itched Other reaction(s): Other (See Comments), Rash - Hives Red itchy hands and feet and roof of mouth itched    Sertraline Hcl     Other reaction(s): Headaches    HOME MEDICATIONS: Outpatient Medications Prior to Visit  Medication Sig Dispense Refill   cetirizine (ZYRTEC) 10 MG tablet      cyclobenzaprine (FLEXERIL) 10 MG tablet Take 0.5-1 tablets (5-10 mg total) by mouth at bedtime as needed, and as tolerated can increase to 3 times daily as needed. 20 tablet 0   Estradiol (DIVIGEL) 0.5 MG/0.5GM GEL Place 1 packet onto the skin daily. 90 each 3   influenza vac split quadrivalent PF (FLUARIX QUADRIVALENT) 0.5 ML injection Inject into the muscle. 0.5 mL 0   lansoprazole (PREVACID) 15 MG capsule Take 1 capsule (15 mg total) by mouth daily at 2 PM. 30 capsule 3   levothyroxine (SYNTHROID) 75 MCG tablet Take 1 tablet (75 mcg total) by mouth in the morning on an empty stomach 90 tablet 3   Probiotic Product (PROBIOTIC PO) Take by  mouth. It is in her fiber pill     progesterone (PROMETRIUM) 100 MG capsule Take 1 capsule (100 mg total) by mouth at bedtime. 90 capsule 3   sertraline (ZOLOFT) 50 MG tablet Take 1 tablet (50 mg total) by mouth daily. 90 tablet 3   sucralfate (CARAFATE) 1 g tablet Take 1 tablet by mouth on an empty stomach 4 times daily 120 tablet 0   VITAMIN D PO Take by mouth daily. 10000 units     fexofenadine-pseudoephedrine (ALLEGRA-D 24)  180-240 MG 24 hr tablet Take 1 tablet by mouth daily. (Patient not taking: Reported on 02/22/2023)     levothyroxine (SYNTHROID) 75 MCG tablet TAKE 1 TABLET BY MOUTH EVERY MORNING ON AN EMPTY STOMACH 90 tablet 3   sertraline (ZOLOFT) 50 MG tablet TAKE 1 TABLET BY MOUTH ONCE DAILY IF NOT TOLERATED CAN TAKE 1/2 TABLET FOR 2 WEEKS 173 tablet 0   No facility-administered medications prior to visit.    PAST MEDICAL HISTORY: Past Medical History:  Diagnosis Date   Anxiety    Clostridium difficile infection 2008   Colon polyps    Depression    Diverticulosis    GERD (gastroesophageal reflux disease)    Irritable bowel syndrome (IBS)    Migraines    Obesity    PVC (premature ventricular contraction)    when stressed   Sleep apnea    mild    Thyroid disease    2010 nodules only   Viral meningitis 1998    PAST SURGICAL HISTORY: Past Surgical History:  Procedure Laterality Date   APPENDECTOMY  1998   CHOLECYSTECTOMY  2000   COLONOSCOPY     ESOPHAGOGASTRODUODENOSCOPY      FAMILY HISTORY: Family History  Problem Relation Age of Onset   Lung cancer Mother    Colonic polyp Mother    Sleep apnea Mother    Lung cancer Father    Breast cancer Sister    Sleep apnea Sister    Tremor Sister    Colon cancer Neg Hx    Esophageal cancer Neg Hx    Rectal cancer Neg Hx     SOCIAL HISTORY: Social History   Socioeconomic History   Marital status: Divorced    Spouse name: Not on file   Number of children: 0   Years of education: Not on file   Highest  education level: Not on file  Occupational History   Occupation: Ecologist  Tobacco Use   Smoking status: Never   Smokeless tobacco: Never  Vaping Use   Vaping status: Never Used  Substance and Sexual Activity   Alcohol use: Yes    Comment: rare   Drug use: Never   Sexual activity: Not on file  Other Topics Concern   Not on file  Social History Narrative   Not on file   Social Determinants of Health   Financial Resource Strain: Not on file  Food Insecurity: Not on file  Transportation Needs: Not on file  Physical Activity: Not on file  Stress: Not on file  Social Connections: Not on file  Intimate Partner Violence: Not on file      PHYSICAL EXAM  Vitals:   02/22/23 0821  BP: 128/83  Pulse: 67  Weight: 212 lb 3.2 oz (96.3 kg)  Height: 5\' 3"  (1.6 m)   Body mass index is 37.59 kg/m.  Generalized: Well developed, in no acute distress  Chest: Lungs clear to auscultation bilaterally  Neurological examination  Mentation: Alert oriented to time, place, history taking. Follows all commands speech and language fluent Cranial nerve II-XII: Extraocular movements were full, visual field were full on confrontational test Head turning and shoulder shrug  were normal and symmetric. Motor: The motor testing reveals 5 over 5 strength of all 4 extremities. Good symmetric motor tone is noted throughout.  Sensory: Sensory testing is intact to soft touch on all 4 extremities. No evidence of extinction is noted.  Gait and station: Gait is normal.    DIAGNOSTIC DATA (LABS, IMAGING,  TESTING) - I reviewed patient records, labs, notes, testing and imaging myself where available.   Lab Results  Component Value Date   HGBA1C 5.6 01/08/2021   Lab Results  Component Value Date   VITAMINB12 655 01/08/2021   Lab Results  Component Value Date   TSH 1.870 05/12/2021      ASSESSMENT AND PLAN 57 y.o. year old female  has a past medical history of Anxiety, Clostridium  difficile infection (2008), Colon polyps, Depression, Diverticulosis, GERD (gastroesophageal reflux disease), Irritable bowel syndrome (IBS), Migraines, Obesity, PVC (premature ventricular contraction), Sleep apnea, Thyroid disease, and Viral meningitis (1998). here with:  OSA on CPAP  - CPAP compliance suboptimal - Good treatment of AHI when using the machine -Repeat home sleep test.  May consider a dental device in the future -Follow-up after testing     Butch Penny, MSN, NP-C 02/22/2023, 8:25 AM [facility] [address], [address] [place], [place] [address] [phone]

## 2023-02-22 NOTE — Patient Instructions (Signed)
Continue using CPAP nightly and greater than 4 hours each night Repeat home sleep test .May consider dental device pending results If your symptoms worsen or you develop new symptoms please let us know.

## 2023-02-26 DIAGNOSIS — R7989 Other specified abnormal findings of blood chemistry: Secondary | ICD-10-CM | POA: Diagnosis not present

## 2023-02-26 DIAGNOSIS — E039 Hypothyroidism, unspecified: Secondary | ICD-10-CM | POA: Diagnosis not present

## 2023-02-26 DIAGNOSIS — E559 Vitamin D deficiency, unspecified: Secondary | ICD-10-CM | POA: Diagnosis not present

## 2023-03-03 ENCOUNTER — Ambulatory Visit: Payer: Commercial Managed Care - PPO | Admitting: Neurology

## 2023-03-03 DIAGNOSIS — G4733 Obstructive sleep apnea (adult) (pediatric): Secondary | ICD-10-CM

## 2023-03-05 DIAGNOSIS — R3589 Other polyuria: Secondary | ICD-10-CM | POA: Diagnosis not present

## 2023-03-05 DIAGNOSIS — F325 Major depressive disorder, single episode, in full remission: Secondary | ICD-10-CM | POA: Diagnosis not present

## 2023-03-05 DIAGNOSIS — E039 Hypothyroidism, unspecified: Secondary | ICD-10-CM | POA: Diagnosis not present

## 2023-03-05 DIAGNOSIS — R7301 Impaired fasting glucose: Secondary | ICD-10-CM | POA: Diagnosis not present

## 2023-03-05 DIAGNOSIS — Z1389 Encounter for screening for other disorder: Secondary | ICD-10-CM | POA: Diagnosis not present

## 2023-03-05 DIAGNOSIS — Z0000 Encounter for general adult medical examination without abnormal findings: Secondary | ICD-10-CM | POA: Diagnosis not present

## 2023-03-05 DIAGNOSIS — E041 Nontoxic single thyroid nodule: Secondary | ICD-10-CM | POA: Diagnosis not present

## 2023-03-05 DIAGNOSIS — Z1331 Encounter for screening for depression: Secondary | ICD-10-CM | POA: Diagnosis not present

## 2023-03-05 DIAGNOSIS — E559 Vitamin D deficiency, unspecified: Secondary | ICD-10-CM | POA: Diagnosis not present

## 2023-03-05 DIAGNOSIS — R1319 Other dysphagia: Secondary | ICD-10-CM | POA: Diagnosis not present

## 2023-03-08 ENCOUNTER — Other Ambulatory Visit (HOSPITAL_COMMUNITY): Payer: Self-pay

## 2023-03-11 ENCOUNTER — Encounter: Payer: Self-pay | Admitting: Adult Health

## 2023-03-11 DIAGNOSIS — G4733 Obstructive sleep apnea (adult) (pediatric): Secondary | ICD-10-CM

## 2023-03-11 NOTE — Progress Notes (Signed)
See procedure note.

## 2023-03-12 NOTE — Procedures (Signed)
[facility]  HOME SLEEP TEST (Watch PAT) REPORT - Mail-out Device  STUDY DATE: 03/05/2023  DOB: [birth year]  MRN: [id number]  ORDERING CLINICIAN: Huston Foley, MD, PhD   REFERRING CLINICIAN: Butch Penny, NP  CLINICAL INFORMATION/HISTORY: 57 year old female with an underlying medical history of diverticulosis, reflux disease, irritable bowel syndrome, migraine headaches, thyroid disease, history of viral meningitis, anxiety, depression, and obesity, who presents for reevaluation of her obstructive sleep apnea.  She has been on AutoPap therapy with suboptimal compliance and difficulty tolerating treatment.  Epworth sleepiness score: 2/24.  BMI: 37.5 kg/m  FINDINGS:   Sleep Summary:   Total Recording Time (hours, min): 9 hours, 50 min  Total Sleep Time (hours, min):  8 hours, 56 min  Percent REM (%):    31%   Respiratory Indices:   Calculated pAHI (per hour):  45.8/hour         REM pAHI:    59.5/hour       NREM pAHI: 39.7/hour  Central pAHI: 1.5/hour  Oxygen Saturation Statistics:    Oxygen Saturation (%) Mean: 92%   Minimum oxygen saturation (%):                 76%   O2 Saturation Range (%): 76-99%    O2 Saturation (minutes) <=88%: 1 min  Pulse Rate Statistics:   Pulse Mean (bpm):    54/min    Pulse Range (43- 100/min)   IMPRESSION: OSA (obstructive sleep apnea), severe  RECOMMENDATION:  This home sleep test demonstrates severe obstructive sleep apnea with a total AHI of 45.8/hour and O2 nadir of 76%.  Snoring was detected, in the mild to moderate range.  Ongoing treatment with positive airway pressure is highly recommended. The patient will be advised to continue with home AutoPap therapy, I would recommend pressure settings of 5 to 11 cm with mask of choice, sized to fit.  She should be eligible for a new AutoPap machine.  A laboratory attended titration study can be considered in the future for optimization of treatment settings and  to improve tolerance and compliance. Alternative treatment options are limited secondary to the severity of the patient's sleep disordered breathing, but may include surgical treatment with an implantable hypoglossal nerve stimulator (in carefully selected candidates, meeting criteria).  Concomitant weight loss is recommended, where clinically appropriate. Please note, that untreated obstructive sleep apnea may carry additional perioperative morbidity. Patients with significant obstructive sleep apnea should receive perioperative PAP therapy and the surgeons and particularly the anesthesiologist should be informed of the diagnosis and the severity of the sleep disordered breathing. The patient should be cautioned not to drive, work at heights, or operate dangerous or heavy equipment when tired or sleepy. Review and reiteration of good sleep hygiene measures should be pursued with any patient. Other causes of the patient's symptoms, including circadian rhythm disturbances, an underlying mood disorder, medication effect and/or an underlying medical problem cannot be ruled out based on this test. Clinical correlation is recommended.  The patient and her referring provider will be notified of the test results. The patient will be seen in follow up in sleep clinic at [facility].  I certify that I have reviewed the raw data recording prior to the issuance of this report in accordance with the standards of the American Academy of Sleep Medicine (AASM).  INTERPRETING PHYSICIAN:   Huston Foley, MD, PhD Medical Director, [facility]) Diplomat, ABPN (Neurology and Sleep)   [facility] [address],  [address] [place], [place] [address] [phone]

## 2023-03-15 ENCOUNTER — Other Ambulatory Visit (HOSPITAL_COMMUNITY): Payer: Self-pay

## 2023-03-15 NOTE — Telephone Encounter (Signed)
Cpap order sent to [facility].

## 2023-03-16 ENCOUNTER — Other Ambulatory Visit: Payer: Self-pay

## 2023-03-18 NOTE — Addendum Note (Signed)
Addended by: Enedina Finner on: 03/18/2023 11:34 AM   Modules accepted: Orders

## 2023-03-18 NOTE — Telephone Encounter (Signed)
[facility] aware of order

## 2023-04-02 ENCOUNTER — Other Ambulatory Visit (HOSPITAL_COMMUNITY): Payer: Self-pay

## 2023-04-02 ENCOUNTER — Encounter (HOSPITAL_COMMUNITY): Payer: Self-pay

## 2023-04-02 ENCOUNTER — Other Ambulatory Visit: Payer: Self-pay

## 2023-04-05 DIAGNOSIS — G4733 Obstructive sleep apnea (adult) (pediatric): Secondary | ICD-10-CM | POA: Diagnosis not present

## 2023-04-06 ENCOUNTER — Other Ambulatory Visit (HOSPITAL_COMMUNITY): Payer: Self-pay

## 2023-04-06 MED ORDER — ESTRADIOL 0.5 MG/0.5GM TD GEL
0.5000 mg | Freq: Every day | TRANSDERMAL | 3 refills | Status: AC
  Filled 2023-04-06 – 2023-08-19 (×2): qty 90, 90d supply, fill #0

## 2023-04-13 ENCOUNTER — Other Ambulatory Visit (HOSPITAL_COMMUNITY): Payer: Self-pay

## 2023-04-15 ENCOUNTER — Other Ambulatory Visit (HOSPITAL_COMMUNITY): Payer: Self-pay

## 2023-04-15 ENCOUNTER — Other Ambulatory Visit: Payer: Self-pay

## 2023-04-15 MED ORDER — ESTRADIOL 0.05 MG/24HR TD PTTW
1.0000 | MEDICATED_PATCH | TRANSDERMAL | 3 refills | Status: DC
  Filled 2023-04-15: qty 24, 84d supply, fill #0
  Filled 2023-06-27: qty 24, 84d supply, fill #1
  Filled 2023-06-28: qty 24, 84d supply, fill #0

## 2023-04-19 ENCOUNTER — Other Ambulatory Visit (HOSPITAL_COMMUNITY): Payer: Self-pay

## 2023-04-26 ENCOUNTER — Other Ambulatory Visit (HOSPITAL_COMMUNITY): Payer: Self-pay

## 2023-04-26 ENCOUNTER — Other Ambulatory Visit (HOSPITAL_BASED_OUTPATIENT_CLINIC_OR_DEPARTMENT_OTHER): Payer: Self-pay

## 2023-04-26 DIAGNOSIS — L308 Other specified dermatitis: Secondary | ICD-10-CM | POA: Diagnosis not present

## 2023-04-26 DIAGNOSIS — R600 Localized edema: Secondary | ICD-10-CM | POA: Diagnosis not present

## 2023-04-26 MED ORDER — PREDNISONE 20 MG PO TABS
20.0000 mg | ORAL_TABLET | Freq: Every day | ORAL | 0 refills | Status: AC
  Filled 2023-04-26: qty 3, 3d supply, fill #0

## 2023-04-26 MED ORDER — MUPIROCIN 2 % EX OINT
TOPICAL_OINTMENT | Freq: Two times a day (BID) | CUTANEOUS | 0 refills | Status: AC
  Filled 2023-04-26: qty 22, 14d supply, fill #0

## 2023-04-26 MED ORDER — TRIAMCINOLONE ACETONIDE 0.1 % EX CREA
TOPICAL_CREAM | Freq: Three times a day (TID) | CUTANEOUS | 1 refills | Status: AC
  Filled 2023-04-26: qty 30, 15d supply, fill #0

## 2023-04-28 DIAGNOSIS — H5213 Myopia, bilateral: Secondary | ICD-10-CM | POA: Diagnosis not present

## 2023-05-05 DIAGNOSIS — G4733 Obstructive sleep apnea (adult) (pediatric): Secondary | ICD-10-CM | POA: Diagnosis not present

## 2023-05-07 ENCOUNTER — Other Ambulatory Visit (HOSPITAL_COMMUNITY): Payer: Self-pay

## 2023-05-13 ENCOUNTER — Other Ambulatory Visit (HOSPITAL_COMMUNITY): Payer: Self-pay

## 2023-05-13 ENCOUNTER — Other Ambulatory Visit: Payer: Self-pay

## 2023-05-14 ENCOUNTER — Other Ambulatory Visit (HOSPITAL_BASED_OUTPATIENT_CLINIC_OR_DEPARTMENT_OTHER): Payer: Self-pay

## 2023-05-14 MED ORDER — INFLUENZA VIRUS VACC SPLIT PF (FLUZONE) 0.5 ML IM SUSY
0.5000 mL | PREFILLED_SYRINGE | Freq: Once | INTRAMUSCULAR | 0 refills | Status: AC
  Filled 2023-05-14: qty 0.5, 1d supply, fill #0

## 2023-05-21 ENCOUNTER — Other Ambulatory Visit (HOSPITAL_COMMUNITY): Payer: Self-pay

## 2023-05-21 ENCOUNTER — Other Ambulatory Visit: Payer: Self-pay

## 2023-05-23 ENCOUNTER — Ambulatory Visit
Admission: EM | Admit: 2023-05-23 | Discharge: 2023-05-23 | Disposition: A | Discharge status: Home / Self Care | Payer: Commercial Managed Care - PPO | Attending: Internal Medicine | Admitting: Internal Medicine

## 2023-05-23 DIAGNOSIS — J209 Acute bronchitis, unspecified: Secondary | ICD-10-CM

## 2023-05-23 MED ORDER — PROMETHAZINE-DM 6.25-15 MG/5ML PO SYRP: 5.0000 mL | ORAL_SOLUTION | Freq: Four times a day (QID) | ORAL | 0 refills | Status: AC | PRN

## 2023-05-23 MED ORDER — ALBUTEROL SULFATE HFA 108 (90 BASE) MCG/ACT IN AERS: 2.0000 | INHALATION_SPRAY | RESPIRATORY_TRACT | 0 refills | Status: DC | PRN

## 2023-05-23 NOTE — Discharge Instructions (Addendum)
Follow-up for worsening symptoms or new symptoms such as fever, chest pain, shortness of breath, failure to improve in 1 week

## 2023-05-23 NOTE — ED Triage Notes (Signed)
Productive Cough and wheezing since Friday. Felt unwell since last wednesday. No fever. Patient has tried nyquil.

## 2023-05-23 NOTE — ED Provider Notes (Signed)
[facility]    CSN: [id number] Arrival date & time: 05/23/23  0855      History   Chief Complaint Chief Complaint  Patient presents with   Cough   Wheezing    HPI Brittany Padilla is a 57 y.o. female.    Cough Associated symptoms: wheezing   Wheezing Associated symptoms: cough   Cough onset 2 days ago associated with occasional chunky white sputum, has had some wheezing when she lays down.  Admits mild fatigue, nasal congestion, rhinorrhea and a scratchy throat.  Had chills yesterday.  Denies documented fever ear pain, chest pain, shortness of breath, abdominal pain, nausea, vomiting, diarrhea.  Known contacts with illness.  She is not a smoker.  Denies history of asthma.  Past Medical History:  Diagnosis Date   Anxiety    Clostridium difficile infection 2008   Colon polyps    Depression    Diverticulosis    GERD (gastroesophageal reflux disease)    Irritable bowel syndrome (IBS)    Migraines    Obesity    PVC (premature ventricular contraction)    when stressed   Sleep apnea    mild    Thyroid disease    2010 nodules only   Viral meningitis 1998    Patient Active Problem List   Diagnosis Date Noted   Avulsion fracture of distal end of fibula 07/21/2022   Gastroesophageal reflux disease 02/19/2022   Dysphagia 02/19/2022   History of esophageal stricture 02/19/2022   Diplopia 05/12/2021   OSA on CPAP 05/12/2021    Past Surgical History:  Procedure Laterality Date   APPENDECTOMY  1998   CHOLECYSTECTOMY  2000   COLONOSCOPY     ESOPHAGOGASTRODUODENOSCOPY      OB History   No obstetric history on file.      Home Medications    Prior to Admission medications   Medication Sig Start Date End Date Taking? Authorizing Provider  lansoprazole (PREVACID) 15 MG capsule Take 1 capsule (15 mg total) by mouth daily at 2 PM. 02/04/23  Yes Zehr, Princella Pellegrini, PA-C  levothyroxine (SYNTHROID) 75 MCG tablet Take 1 tablet (75 mcg total) by mouth in the  morning on an empty stomach 05/21/22  Yes   sertraline (ZOLOFT) 50 MG tablet Take 1 tablet (50 mg total) by mouth daily. 06/02/22  Yes   cetirizine (ZYRTEC) 10 MG tablet  06/13/19   [provider]  cyclobenzaprine (FLEXERIL) 10 MG tablet Take 0.5-1 tablets (5-10 mg total) by mouth at bedtime as needed, and as tolerated can increase to 3 times daily as needed. 12/08/21     Estradiol (DIVIGEL) 0.5 MG/0.5GM GEL Place 0.5 mg onto the skin daily. 04/05/23     estradiol (VIVELLE-DOT) 0.05 MG/24HR patch Place 1 patch (0.05 mg total) onto the skin 2 (two) times a week. 04/15/23     fexofenadine-pseudoephedrine (ALLEGRA-D 24) 180-240 MG 24 hr tablet Take 1 tablet by mouth daily. Patient not taking: Reported on 02/22/2023    [provider]  influenza vac split quadrivalent PF (FLUARIX QUADRIVALENT) 0.5 ML injection Inject into the muscle. 05/15/22   Judyann Munson, MD  levothyroxine (SYNTHROID) 75 MCG tablet TAKE 1 TABLET BY MOUTH EVERY MORNING ON AN EMPTY STOMACH 05/31/20 06/08/21  Charlane Ferretti, DO  mupirocin ointment (BACTROBAN) 2 % Apply to affected area(s) twice daily for 5 days 04/26/23     Probiotic Product (PROBIOTIC PO) Take by mouth. It is in her fiber pill    [provider]  progesterone (PROMETRIUM) 100 MG capsule Take 1 capsule (100 mg total) by mouth at bedtime. 10/01/22     sertraline (ZOLOFT) 50 MG tablet TAKE 1 TABLET BY MOUTH ONCE DAILY IF NOT TOLERATED CAN TAKE 1/2 TABLET FOR 2 WEEKS 10/29/20 10/29/21  Charlane Ferretti, DO  sucralfate (CARAFATE) 1 g tablet Take 1 tablet by mouth on an empty stomach 4 times daily 04/07/21     triamcinolone cream (KENALOG) 0.1 % Apply to affected area(s) three times daily 04/26/23     VITAMIN D PO Take by mouth daily. 10000 units    [provider]    Family History Family History  Problem Relation Age of Onset   Lung cancer Mother    Colonic polyp Mother    Sleep apnea Mother    Lung cancer Father    Breast cancer Sister     Sleep apnea Sister    Tremor Sister    Colon cancer Neg Hx    Esophageal cancer Neg Hx    Rectal cancer Neg Hx     Social History Social History   Tobacco Use   Smoking status: Never   Smokeless tobacco: Never  Vaping Use   Vaping status: Never Used  Substance Use Topics   Alcohol use: Yes    Comment: rare   Drug use: Never     Allergies   Ansaid [flurbiprofen], Ibuprofen, Latex, Nsaids, Celecoxib, and Sertraline hcl   Review of Systems Review of Systems  Respiratory:  Positive for cough and wheezing.      Physical Exam Triage Vital Signs ED Triage Vitals  Encounter Vitals Group     BP 05/23/23 0939 134/86     Systolic BP Percentile --      Diastolic BP Percentile --      Pulse Rate 05/23/23 0939 88     Resp 05/23/23 0939 18     Temp 05/23/23 0939 98.4 F (36.9 C)     Temp Source 05/23/23 0939 Oral     SpO2 05/23/23 0939 95 %     Weight --      Height --      Head Circumference --      Peak Flow --      Pain Score 05/23/23 0937 0     Pain Loc --      Pain Education --      Exclude from Growth Chart --    No data found.  Updated Vital Signs BP 134/86 (BP Location: Left Arm)   Pulse 88   Temp 98.4 F (36.9 C) (Oral)   Resp 18   SpO2 95%   Visual Acuity Right Eye Distance:   Left Eye Distance:   Bilateral Distance:    Right Eye Near:   Left Eye Near:    Bilateral Near:     Physical Exam Vitals and nursing note reviewed.  Constitutional:      Appearance: Normal appearance. She is not ill-appearing, toxic-appearing or diaphoretic.  HENT:     Head: Normocephalic and atraumatic.     Right Ear: Tympanic membrane and ear canal normal.     Left Ear: Tympanic membrane and ear canal normal.     Nose: No rhinorrhea.     Mouth/Throat:     Mouth: Mucous membranes are moist.     Pharynx: No oropharyngeal exudate or posterior oropharyngeal erythema.  Cardiovascular:     Rate and Rhythm: Normal rate and regular rhythm.     Heart sounds: Normal  heart sounds.  Pulmonary:     Effort: Pulmonary effort is normal.     Breath sounds: No stridor. Wheezing (Mild scattered end expiratory wheezing) present. No rhonchi.  Musculoskeletal:     Cervical back: Neck supple.  Lymphadenopathy:     Cervical: No cervical adenopathy.  Skin:    General: Skin is warm and dry.  Neurological:     Mental Status: She is alert and oriented to person, place, and time.      UC Treatments / Results  Labs (all labs ordered are listed, but only abnormal results are displayed) Labs Reviewed - No data to display  EKG   Radiology No results found.  Procedures Procedures (including critical care time)  Medications Ordered in UC Medications - No data to display  Initial Impression / Assessment and Plan / UC Course  I have reviewed the triage vital signs and the nursing notes.  Pertinent labs & imaging results that were available during my care of the patient were reviewed by me and considered in my medical decision making (see chart for details).     57 year old non-smoker presents with cough with white sputum for 2 days associated with some wheezing.  Her vital signs are stable she has mild end expiratory wheezing on exam, will treat with inhaler and cough medicine, warning signs and follow-up reviewed see PCP if failure to improve or new symptoms Final Clinical Impressions(s) / UC Diagnoses   Final diagnoses:  None   Discharge Instructions   None    ED Prescriptions   None    PDMP not reviewed this encounter.   Meliton Rattan, [place] 05/23/23 [phone]

## 2023-05-25 ENCOUNTER — Other Ambulatory Visit (HOSPITAL_BASED_OUTPATIENT_CLINIC_OR_DEPARTMENT_OTHER): Payer: Self-pay

## 2023-05-25 DIAGNOSIS — G4733 Obstructive sleep apnea (adult) (pediatric): Secondary | ICD-10-CM | POA: Diagnosis not present

## 2023-05-26 ENCOUNTER — Other Ambulatory Visit (HOSPITAL_BASED_OUTPATIENT_CLINIC_OR_DEPARTMENT_OTHER): Payer: Self-pay

## 2023-05-26 MED ORDER — SERTRALINE HCL 50 MG PO TABS
50.0000 mg | ORAL_TABLET | Freq: Every day | ORAL | 4 refills | Status: DC
  Filled 2023-05-26: qty 90, 90d supply, fill #0
  Filled 2023-08-17: qty 90, 90d supply, fill #1
  Filled 2023-11-12: qty 90, 90d supply, fill #2
  Filled 2024-02-15: qty 90, 90d supply, fill #3

## 2023-05-27 ENCOUNTER — Other Ambulatory Visit (HOSPITAL_BASED_OUTPATIENT_CLINIC_OR_DEPARTMENT_OTHER): Payer: Self-pay

## 2023-05-27 DIAGNOSIS — J209 Acute bronchitis, unspecified: Secondary | ICD-10-CM | POA: Diagnosis not present

## 2023-05-27 DIAGNOSIS — R058 Other specified cough: Secondary | ICD-10-CM | POA: Diagnosis not present

## 2023-05-27 MED ORDER — PREDNISONE 20 MG PO TABS
40.0000 mg | ORAL_TABLET | Freq: Every day | ORAL | 0 refills | Status: DC
  Filled 2023-05-27: qty 10, 5d supply, fill #0

## 2023-05-28 ENCOUNTER — Other Ambulatory Visit (HOSPITAL_BASED_OUTPATIENT_CLINIC_OR_DEPARTMENT_OTHER): Payer: Self-pay

## 2023-05-31 ENCOUNTER — Other Ambulatory Visit (HOSPITAL_BASED_OUTPATIENT_CLINIC_OR_DEPARTMENT_OTHER): Payer: Self-pay

## 2023-06-02 DIAGNOSIS — H532 Diplopia: Secondary | ICD-10-CM | POA: Diagnosis not present

## 2023-06-03 ENCOUNTER — Other Ambulatory Visit (HOSPITAL_BASED_OUTPATIENT_CLINIC_OR_DEPARTMENT_OTHER): Payer: Self-pay

## 2023-06-04 ENCOUNTER — Other Ambulatory Visit (HOSPITAL_BASED_OUTPATIENT_CLINIC_OR_DEPARTMENT_OTHER): Payer: Self-pay

## 2023-06-08 ENCOUNTER — Other Ambulatory Visit (HOSPITAL_BASED_OUTPATIENT_CLINIC_OR_DEPARTMENT_OTHER): Payer: Self-pay

## 2023-06-08 MED ORDER — LEVOTHYROXINE SODIUM 75 MCG PO TABS
75.0000 ug | ORAL_TABLET | Freq: Every morning | ORAL | 3 refills | Status: DC
  Filled 2023-06-08: qty 90, 90d supply, fill #0
  Filled 2023-09-22: qty 90, 90d supply, fill #1
  Filled 2023-12-18: qty 90, 90d supply, fill #2
  Filled 2024-03-17: qty 90, 90d supply, fill #3

## 2023-06-16 ENCOUNTER — Other Ambulatory Visit (HOSPITAL_BASED_OUTPATIENT_CLINIC_OR_DEPARTMENT_OTHER): Payer: Self-pay

## 2023-06-16 DIAGNOSIS — H532 Diplopia: Secondary | ICD-10-CM | POA: Diagnosis not present

## 2023-06-24 DIAGNOSIS — G4733 Obstructive sleep apnea (adult) (pediatric): Secondary | ICD-10-CM | POA: Diagnosis not present

## 2023-06-27 ENCOUNTER — Other Ambulatory Visit: Payer: Self-pay | Admitting: Gastroenterology

## 2023-06-28 ENCOUNTER — Other Ambulatory Visit (HOSPITAL_BASED_OUTPATIENT_CLINIC_OR_DEPARTMENT_OTHER): Payer: Self-pay

## 2023-06-28 MED ORDER — ESTRADIOL 0.0375 MG/24HR TD PTTW
1.0000 | MEDICATED_PATCH | TRANSDERMAL | 0 refills | Status: DC
  Filled 2023-06-28: qty 24, 84d supply, fill #0

## 2023-06-29 ENCOUNTER — Other Ambulatory Visit (HOSPITAL_BASED_OUTPATIENT_CLINIC_OR_DEPARTMENT_OTHER): Payer: Self-pay

## 2023-07-05 DIAGNOSIS — G4733 Obstructive sleep apnea (adult) (pediatric): Secondary | ICD-10-CM | POA: Diagnosis not present

## 2023-07-25 DIAGNOSIS — G4733 Obstructive sleep apnea (adult) (pediatric): Secondary | ICD-10-CM | POA: Diagnosis not present

## 2023-08-05 DIAGNOSIS — G4733 Obstructive sleep apnea (adult) (pediatric): Secondary | ICD-10-CM | POA: Diagnosis not present

## 2023-08-17 ENCOUNTER — Other Ambulatory Visit (HOSPITAL_BASED_OUTPATIENT_CLINIC_OR_DEPARTMENT_OTHER): Payer: Self-pay

## 2023-08-19 ENCOUNTER — Other Ambulatory Visit (HOSPITAL_BASED_OUTPATIENT_CLINIC_OR_DEPARTMENT_OTHER): Payer: Self-pay

## 2023-08-23 ENCOUNTER — Other Ambulatory Visit (HOSPITAL_BASED_OUTPATIENT_CLINIC_OR_DEPARTMENT_OTHER): Payer: Self-pay

## 2023-08-23 DIAGNOSIS — R609 Edema, unspecified: Secondary | ICD-10-CM | POA: Diagnosis not present

## 2023-08-23 DIAGNOSIS — R140 Abdominal distension (gaseous): Secondary | ICD-10-CM | POA: Diagnosis not present

## 2023-08-23 MED ORDER — TRIAMTERENE-HCTZ 37.5-25 MG PO CAPS
1.0000 | ORAL_CAPSULE | Freq: Every day | ORAL | 1 refills | Status: DC
  Filled 2023-08-23: qty 30, 30d supply, fill #0

## 2023-09-03 ENCOUNTER — Other Ambulatory Visit (HOSPITAL_BASED_OUTPATIENT_CLINIC_OR_DEPARTMENT_OTHER): Payer: Self-pay

## 2023-09-03 DIAGNOSIS — F325 Major depressive disorder, single episode, in full remission: Secondary | ICD-10-CM | POA: Diagnosis not present

## 2023-09-03 DIAGNOSIS — E559 Vitamin D deficiency, unspecified: Secondary | ICD-10-CM | POA: Diagnosis not present

## 2023-09-03 DIAGNOSIS — R059 Cough, unspecified: Secondary | ICD-10-CM | POA: Diagnosis not present

## 2023-09-03 DIAGNOSIS — E039 Hypothyroidism, unspecified: Secondary | ICD-10-CM | POA: Diagnosis not present

## 2023-09-03 DIAGNOSIS — J111 Influenza due to unidentified influenza virus with other respiratory manifestations: Secondary | ICD-10-CM | POA: Diagnosis not present

## 2023-09-03 DIAGNOSIS — R7301 Impaired fasting glucose: Secondary | ICD-10-CM | POA: Diagnosis not present

## 2023-09-03 DIAGNOSIS — R1319 Other dysphagia: Secondary | ICD-10-CM | POA: Diagnosis not present

## 2023-09-03 DIAGNOSIS — Z6835 Body mass index (BMI) 35.0-35.9, adult: Secondary | ICD-10-CM | POA: Diagnosis not present

## 2023-09-03 MED ORDER — PREDNISONE 20 MG PO TABS
40.0000 mg | ORAL_TABLET | Freq: Every day | ORAL | 0 refills | Status: AC
  Filled 2023-09-03: qty 10, 5d supply, fill #0

## 2023-09-05 DIAGNOSIS — G4733 Obstructive sleep apnea (adult) (pediatric): Secondary | ICD-10-CM | POA: Diagnosis not present

## 2023-09-22 ENCOUNTER — Other Ambulatory Visit (HOSPITAL_BASED_OUTPATIENT_CLINIC_OR_DEPARTMENT_OTHER): Payer: Self-pay

## 2023-09-22 DIAGNOSIS — Z6838 Body mass index (BMI) 38.0-38.9, adult: Secondary | ICD-10-CM | POA: Diagnosis not present

## 2023-09-22 DIAGNOSIS — Z01419 Encounter for gynecological examination (general) (routine) without abnormal findings: Secondary | ICD-10-CM | POA: Diagnosis not present

## 2023-09-22 DIAGNOSIS — R140 Abdominal distension (gaseous): Secondary | ICD-10-CM | POA: Diagnosis not present

## 2023-09-22 DIAGNOSIS — Z1231 Encounter for screening mammogram for malignant neoplasm of breast: Secondary | ICD-10-CM | POA: Diagnosis not present

## 2023-09-22 MED ORDER — PROGESTERONE MICRONIZED 100 MG PO CAPS
100.0000 mg | ORAL_CAPSULE | Freq: Every day | ORAL | 3 refills | Status: AC
  Filled 2023-09-22: qty 90, 90d supply, fill #0
  Filled 2023-12-18: qty 90, 90d supply, fill #1
  Filled 2024-03-08: qty 90, 90d supply, fill #2
  Filled 2024-06-09: qty 90, 90d supply, fill #3

## 2023-09-22 MED ORDER — ESTRADIOL 0.5 MG/0.5GM TD GEL
0.5000 mg | Freq: Every day | TRANSDERMAL | 3 refills | Status: DC
  Filled 2023-09-22: qty 90, 90d supply, fill #0

## 2023-09-23 ENCOUNTER — Other Ambulatory Visit (HOSPITAL_BASED_OUTPATIENT_CLINIC_OR_DEPARTMENT_OTHER): Payer: Self-pay

## 2023-09-24 ENCOUNTER — Other Ambulatory Visit (HOSPITAL_BASED_OUTPATIENT_CLINIC_OR_DEPARTMENT_OTHER): Payer: Self-pay

## 2023-09-27 ENCOUNTER — Other Ambulatory Visit (HOSPITAL_BASED_OUTPATIENT_CLINIC_OR_DEPARTMENT_OTHER): Payer: Self-pay

## 2023-09-28 ENCOUNTER — Other Ambulatory Visit (HOSPITAL_BASED_OUTPATIENT_CLINIC_OR_DEPARTMENT_OTHER): Payer: Self-pay

## 2023-09-29 ENCOUNTER — Other Ambulatory Visit (HOSPITAL_BASED_OUTPATIENT_CLINIC_OR_DEPARTMENT_OTHER): Payer: Self-pay

## 2023-09-29 MED ORDER — ESTRADIOL 0.75 MG/1.25 GM (0.06%) TD GEL
TRANSDERMAL | 3 refills | Status: AC
  Filled 2023-09-29 (×2): qty 112.5, 90d supply, fill #0
  Filled 2023-12-24: qty 112.5, 90d supply, fill #1
  Filled 2024-06-09 – 2024-07-05 (×2): qty 112.5, 90d supply, fill #2

## 2023-09-30 ENCOUNTER — Other Ambulatory Visit (HOSPITAL_BASED_OUTPATIENT_CLINIC_OR_DEPARTMENT_OTHER): Payer: Self-pay

## 2023-10-03 DIAGNOSIS — G4733 Obstructive sleep apnea (adult) (pediatric): Secondary | ICD-10-CM | POA: Diagnosis not present

## 2023-10-14 ENCOUNTER — Other Ambulatory Visit (HOSPITAL_BASED_OUTPATIENT_CLINIC_OR_DEPARTMENT_OTHER): Payer: Self-pay

## 2023-10-14 MED ORDER — CYCLOBENZAPRINE HCL 10 MG PO TABS
5.0000 mg | ORAL_TABLET | Freq: Every evening | ORAL | 0 refills | Status: AC | PRN
  Filled 2023-10-14: qty 20, 7d supply, fill #0

## 2023-11-03 DIAGNOSIS — G4733 Obstructive sleep apnea (adult) (pediatric): Secondary | ICD-10-CM | POA: Diagnosis not present

## 2023-11-12 ENCOUNTER — Other Ambulatory Visit (HOSPITAL_BASED_OUTPATIENT_CLINIC_OR_DEPARTMENT_OTHER): Payer: Self-pay

## 2023-11-29 DIAGNOSIS — G4733 Obstructive sleep apnea (adult) (pediatric): Secondary | ICD-10-CM | POA: Diagnosis not present

## 2023-12-03 DIAGNOSIS — G4733 Obstructive sleep apnea (adult) (pediatric): Secondary | ICD-10-CM | POA: Diagnosis not present

## 2023-12-20 ENCOUNTER — Other Ambulatory Visit (HOSPITAL_BASED_OUTPATIENT_CLINIC_OR_DEPARTMENT_OTHER): Payer: Self-pay

## 2023-12-24 ENCOUNTER — Other Ambulatory Visit (HOSPITAL_BASED_OUTPATIENT_CLINIC_OR_DEPARTMENT_OTHER): Payer: Self-pay

## 2023-12-27 ENCOUNTER — Other Ambulatory Visit (HOSPITAL_BASED_OUTPATIENT_CLINIC_OR_DEPARTMENT_OTHER): Payer: Self-pay

## 2024-01-03 DIAGNOSIS — G4733 Obstructive sleep apnea (adult) (pediatric): Secondary | ICD-10-CM | POA: Diagnosis not present

## 2024-02-09 ENCOUNTER — Other Ambulatory Visit (HOSPITAL_BASED_OUTPATIENT_CLINIC_OR_DEPARTMENT_OTHER): Payer: Self-pay

## 2024-02-09 MED ORDER — TRIAMTERENE-HCTZ 37.5-25 MG PO CAPS
1.0000 | ORAL_CAPSULE | Freq: Every day | ORAL | 1 refills | Status: AC | PRN
  Filled 2024-02-09: qty 30, 30d supply, fill #0

## 2024-02-15 ENCOUNTER — Other Ambulatory Visit (HOSPITAL_BASED_OUTPATIENT_CLINIC_OR_DEPARTMENT_OTHER): Payer: Self-pay

## 2024-02-18 ENCOUNTER — Encounter: Payer: Self-pay | Admitting: Gastroenterology

## 2024-02-18 ENCOUNTER — Ambulatory Visit: Admitting: Gastroenterology

## 2024-02-18 VITALS — BP 132/80 | HR 72 | Ht 63.0 in | Wt 226.0 lb

## 2024-02-18 DIAGNOSIS — K219 Gastro-esophageal reflux disease without esophagitis: Secondary | ICD-10-CM | POA: Diagnosis not present

## 2024-02-18 DIAGNOSIS — K581 Irritable bowel syndrome with constipation: Secondary | ICD-10-CM | POA: Diagnosis not present

## 2024-02-18 DIAGNOSIS — R1013 Epigastric pain: Secondary | ICD-10-CM | POA: Diagnosis not present

## 2024-02-18 DIAGNOSIS — R1312 Dysphagia, oropharyngeal phase: Secondary | ICD-10-CM

## 2024-02-18 NOTE — Progress Notes (Signed)
 Chief Complaint:discuss colon Primary GI Doctor:(Dr. Teressa, Dr. Eda) Dr. San   HPI:  Patient is a  58  year old female patient with past medical history of IBS with constipation predominance,diverticulosis,  personal history of colon polyps, history of C diff, chronic dysphagia, who presents for follow-up to discuss colon.   Last seen in GI office by Harlene, PA on 04/16/22  Interval History     Patient presents for evaluation of uncontrolled GERD and oropharyngeal dysphagia. She is currently taking Pepcid 20 mg po once daily. She does avoid PPI therapies due to history of Cdiff.   Patient also complains of oropharyngeal dysphagia with mostly solids. She notes she chokes sometimes if food is caught in back of throat. She also has a lot of burping.  She had EGD with dilatation back in 2023 without improvement with the dysphagia.She does mention family history of essential tremors and movement disorders in two sisters. She also was seen by ENT who mentioned she had reflux.    Patient reports she has also had epigastric pain that radiates to back similar to when she had gallbladder removed in 2000.  She cannot note any triggers. She reports it has been ongoing for a few months. She reports it typically occurs during day. No dark stools or blood in stools. No back issues. She has had some weight gain.     Patient has history of chronic constipation and is taking OTC Metamucil and Kefir which keeps her bowels regular. No constipation currently.   No NSAID use.   Surgical history: gallbladder, appendectomy   Patient's family history includes: Lung CA in both parents (smokers)  Wt Readings from Last 3 Encounters:  02/18/24 226 lb (102.5 kg)  02/22/23 212 lb 3.2 oz (96.3 kg)  08/11/22 200 lb (90.7 kg)     Past Medical History:  Diagnosis Date   Anxiety    Clostridium difficile infection 2008   Colon polyps    Depression    Diverticulosis    GERD (gastroesophageal reflux  disease)    Irritable bowel syndrome (IBS)    Migraines    Obesity    PVC (premature ventricular contraction)    when stressed   Sleep apnea    mild    Thyroid  disease    2010 nodules only   Viral meningitis 1998    Past Surgical History:  Procedure Laterality Date   APPENDECTOMY  1998   CHOLECYSTECTOMY  2000   COLONOSCOPY     ESOPHAGOGASTRODUODENOSCOPY      Current Outpatient Medications  Medication Sig Dispense Refill   cetirizine (ZYRTEC) 10 MG tablet      cyclobenzaprine  (FLEXERIL ) 10 MG tablet Take 0.5-1 tablets (5-10 mg total) by mouth at bedtime as needed, and as tolerated can increase to 3 times daily as needed. 20 tablet 0   Estradiol  0.75 MG/1.25 GM (0.06%) topical gel Apply 1 pump every day by topical route for 90 days. 112.5 g 3   famotidine (PEPCID) 40 MG/5ML suspension Take by mouth daily.     fexofenadine-pseudoephedrine (ALLEGRA-D 24) 180-240 MG 24 hr tablet Take 1 tablet by mouth daily.     influenza vac split quadrivalent PF (FLUARIX  QUADRIVALENT) 0.5 ML injection Inject into the muscle. 0.5 mL 0   levocetirizine (XYZAL) 2.5 MG/5ML solution Take 2.5 mg by mouth every evening.     levothyroxine  (SYNTHROID ) 75 MCG tablet TAKE 1 TABLET BY MOUTH EVERY MORNING ON AN EMPTY STOMACH 90 tablet 3   mupirocin  ointment (BACTROBAN )  2 % Apply to affected area(s) twice daily for 5 days 22 g 0   Probiotic Product (PROBIOTIC PO) Take by mouth. It is in her fiber pill     progesterone  (PROMETRIUM ) 100 MG capsule Take 1 capsule (100 mg total) by mouth at bedtime. 90 capsule 3   psyllium (METAMUCIL) 58.6 % packet Take 1 packet by mouth daily.     sertraline  (ZOLOFT ) 50 MG tablet TAKE 1 TABLET BY MOUTH ONCE DAILY IF NOT TOLERATED CAN TAKE 1/2 TABLET FOR 2 WEEKS 173 tablet 0   sertraline  (ZOLOFT ) 50 MG tablet Take 1 tablet (50 mg total) by mouth daily. 90 tablet 4   triamcinolone  cream (KENALOG ) 0.1 % Apply to affected area(s) three times daily 30 g 1    triamterene -hydrochlorothiazide  (DYAZIDE ) 37.5-25 MG capsule Take 1 each (1 capsule total) by mouth daily as needed for edema 30 capsule 1   VITAMIN D PO Take by mouth daily. 10000 units     albuterol  (VENTOLIN  HFA) 108 (90 Base) MCG/ACT inhaler Inhale 2 puffs into the lungs every 4 (four) hours as needed for up to 10 days for wheezing or shortness of breath. 6.8 g 0   Estradiol  (DIVIGEL ) 0.5 MG/0.5GM GEL Place 0.5 mg onto the skin daily. (Patient not taking: Reported on 02/18/2024) 90 each 3   levothyroxine  (SYNTHROID ) 75 MCG tablet Take 1 tablet (75 mcg total) by mouth in the morning on an empty stomach (Patient not taking: Reported on 02/18/2024) 90 tablet 3   predniSONE  (DELTASONE ) 20 MG tablet Take 2 tablets (40 mg total) by mouth daily for 5 days (Patient not taking: Reported on 02/18/2024) 10 tablet 0   sucralfate  (CARAFATE ) 1 g tablet Take 1 tablet by mouth on an empty stomach 4 times daily (Patient not taking: Reported on 02/18/2024) 120 tablet 0   No current facility-administered medications for this visit.    Allergies as of 02/18/2024 - Review Complete 02/18/2024  Allergen Reaction Noted   Ansaid [flurbiprofen]  08/18/2019   Ibuprofen  08/18/2019   Latex Hives 05/23/2023   Nsaids Hives and Other (See Comments) 09/05/2014   Celecoxib Other (See Comments), Itching, and Rash 05/24/2017   Sertraline  hcl  05/24/2017    Family History  Problem Relation Age of Onset   Lung cancer Mother    Colonic polyp Mother    Sleep apnea Mother    Lung cancer Father    Breast cancer Sister    Sleep apnea Sister    Tremor Sister    Colon cancer Neg Hx    Esophageal cancer Neg Hx    Rectal cancer Neg Hx     Review of Systems:    Constitutional: No weight loss, fever, chills, weakness or fatigue HEENT: Eyes: No change in vision               Ears, Nose, Throat:  No change in hearing or congestion Skin: No rash or itching Cardiovascular: No chest pain, chest pressure or palpitations    Respiratory: No SOB or cough Gastrointestinal: See HPI and otherwise negative Genitourinary: No dysuria or change in urinary frequency Neurological: No headache, dizziness or syncope Musculoskeletal: No new muscle or joint pain Hematologic: No bleeding or bruising Psychiatric: No history of depression or anxiety    Physical Exam:  Vital signs: BP 132/80   Pulse 72   Ht 5' 3 (1.6 m)   Wt 226 lb (102.5 kg)   BMI 40.03 kg/m   Constitutional:   Pleasant female appears to  be in NAD, Well developed, Well nourished, alert and cooperative Throat: Oral cavity and pharynx without inflammation, swelling or lesion.  Respiratory: Respirations even and unlabored. Lungs clear to auscultation bilaterally.   No wheezes, crackles, or rhonchi.  Cardiovascular: Normal S1, S2. Regular rate and rhythm. No peripheral edema, cyanosis or pallor.  Gastrointestinal:  Soft, nondistended, nontender. No rebound or guarding. Normal bowel sounds. No appreciable masses or hepatomegaly. Rectal:  Not performed.  Msk:  Symmetrical without gross deformities. Without edema, no deformity or joint abnormality.  Neurologic:  Alert and  oriented x4;  grossly normal neurologically.  Skin:   Dry and intact without significant lesions or rashes.  RELEVANT LABS AND IMAGING: GI Procedures:  EGD 02/2022 with Dr. Eda: - No endoscopic esophageal abnormality to explain patient's dysphagia. Esophagus dilated. Biopsied. - Erosive gastropathy with no bleeding and no stigmata of recent bleeding. Biopsied. - Normal examined duodenum. Biopsied. - The examination was otherwise normal.   1. Surgical [P], gastric antrum and gastric body - GASTRIC ANTRAL MUCOSA WITH NONSPECIFIC REACTIVE GASTROPATHY - GASTRIC OXYNTIC MUCOSA WITH PARIETAL CELL HYPERPLASIA AS CAN BE SEEN IN HYPERGASTRINEMIC STATES SUCH AS PPI THERAPY. - HELICOBACTER PYLORI-LIKE ORGANISMS ARE NOT IDENTIFIED ON ROUTINE H&E STAIN 2. Surgical [P], distal esophagus -  ESOPHAGEAL SQUAMOUS MUCOSA WITH MILD VASCULAR CONGESTION, AND FOCAL SQUAMOUS BALLOONING, SUGGESTIVE OF REFLUX ESOPHAGITIS - NEGATIVE FOR INCREASED INTRAEPITHELIAL EOSINOPHILS 3. Surgical [P], mid/ proximal esophagus - ESOPHAGEAL SQUAMOUS MUCOSA WITH NO SPECIFIC HISTOPATHOLOGIC CHANGES - NEGATIVE FOR INCREASED INTRAEPITHELIAL EOSINOPHILS     --Gastric emptying scan December 2011 was normal   --Pathology results from colonoscopy and upper endoscopy October 2018: Polyps from her colon were all hyperplastic.  GE junction showed GERD damage.  No eosinophilic esophagitis.  The duodenum was normal.  The stomach showed mild chronic gastritis without H. Pylori.   EGD report October 2018 indications GERD and dysphagia.  Findings benign intrinsic 13 mm stricture was seen at the GE junction it was dilated to 15 mm with a balloon.  Esophagitis was seen as well.  Biopsies were taken from a normal esophagus.   Colonoscopy report October 2018 described small 4 mm polyp in the sigmoid colon, diverticulosis in the left colon.  The examination was otherwise normal.   Blood work December 2010 celiac sprue panel was all negative including TTG and total IgA   EGD report November 2011 done for dysphagia and GERD described normal duodenum, hiatal hernia, erythema in the lower esophagus.  Otherwise normal examination.  Biopsies were taken from her stomach I believe.   Sits marker testing November 2011 showed no remaining sits markers  Esophageal manometry report November 2011 was normal   Ambulatory pH monitoring test November 2011 showed poor gastric acid suppression also positive symptom association with regurgitation of throat pain this was done while on medicines at Nexium 40 mg once daily.   Assessment: Encounter Diagnoses  Name Primary?   Gastroesophageal reflux disease, unspecified whether esophagitis present Yes   Oropharyngeal dysphagia    Abdominal pain, epigastric    Irritable bowel  syndrome with constipation      58 year old female patient with GERD and chronic dysphagia.  Patient is currently only taking Pepcid 20 mg once daily.  She inquires if she can take it twice daily.  There are some concerns with using PPI therapy due to history of C. difficile.  I will provide samples of Voquenza a potassium-competitive acid blocker (PCAB) reach out to the drug rep to see what percentage of patients  have reported C. difficile from use.  This Sherod Cisse be a alternative option if the Pepcid twice daily does not control her symptoms.  We also discussed surgical options if patient is a candidate.  Will go ahead and check lab work to rule out acute process.  She has gained about 26lbs in the past 1.5 years which can increase GERD. I will also order barium esophagram with tablet to rule out esophageal motility disorders or strictures as patient did not have relief with EGD with dilatation during last endoscopic procedure.  Alyjah Lovingood at some point need to rescoped her and consider pH impedance study/manometry for further evaluation.      Patient's constipation is well-controlled with fiber supplementation, instructed patient to use MiraLAX as needed.  Patient up-to-date on colonoscopy.   Plan: -Order Barium esophagram with tablet -CBC,CMP, lipase (take slip to have drawn with annual labs) -Increase Pepcid 20 mg to twice daily  -Provided Voquenza 10mg  samples provided  -Discussed TIF procedure as possibly alternative, can discuss with Dr. San at follow-up. Would need reevaluation with endoscopic procedures and manometry. -Continue Metamucil po daily -OTC Miralax prn  -colonoscopy recall 04/2027 -Follow-up with Dr. San in 3 mths  Thank you for the courtesy of this consult. Please call me with any questions or concerns.   Brittany Amaral, FNP-C [facility] 02/18/2024, 9:41 AM  Cc: Brittany Skates, DO

## 2024-02-18 NOTE — Patient Instructions (Addendum)
 GERD Voquenza- alternative antiacid in different class, take 1 tablet in the morning. Could add pepcid at bedtime if you would like.  Or Pepcid 20 mg 1 tablet twice daily  Can discuss with Dr. San if candidate for TIF procedure- surgical option for GERD.    Epigastric pain Labs: CBC, CMP, lipase  Avoid NSAIDs  Constipation Continue Metamucil Can add OTC Miralax prn  Your provider has requested that you go to the basement level for lab work before leaving today. Press B on the elevator. The lab is located at the first door on the left as you exit the elevator.  You have been scheduled for a Barium Esophogram at [facility] (1st floor of the hospital) on 02/25/24 at 9:00AM. Please arrive 30 minutes prior to your appointment for registration. Make certain not to have anything to eat or drink 3 hours (6:00AM) prior to your test. If you need to reschedule for any reason, please contact radiology at [phone] to do so. __________________________________________________________________ A barium swallow is an examination that concentrates on views of the esophagus. This tends to be a double contrast exam (barium and two liquids which, when combined, create a gas to distend the wall of the oesophagus) or single contrast (non-ionic iodine based). The study is usually tailored to your symptoms so a good history is essential. Attention is paid during the study to the form, structure and configuration of the esophagus, looking for functional disorders (such as aspiration, dysphagia, achalasia, motility and reflux) EXAMINATION You may be asked to change into a gown, depending on the type of swallow being performed. A radiologist and radiographer will perform the procedure. The radiologist will advise you of the type of contrast selected for your procedure and direct you during the exam. You will be asked to stand, sit or lie in several different positions and to hold a small amount of  fluid in your mouth before being asked to swallow while the imaging is performed .In some instances you may be asked to swallow barium coated marshmallows to assess the motility of a solid food bolus. The exam can be recorded as a digital or video fluoroscopy procedure. POST PROCEDURE It will take 1-2 days for the barium to pass through your system. To facilitate this, it is important, unless otherwise directed, to increase your fluids for the next 24-48hrs and to resume your normal diet.  This test typically takes about 30 minutes to perform. __________________________________________________________________________________   _______________________________________________________  If your blood pressure at your visit was 140/90 or greater, please contact your primary care physician to follow up on this.  _______________________________________________________  If you are age 102 or older, your body mass index should be between 23-30. Your Body mass index is 40.03 kg/m. If this is out of the aforementioned range listed, please consider follow up with your Primary Care Provider.  If you are age 82 or younger, your body mass index should be between 19-25. Your Body mass index is 40.03 kg/m. If this is out of the aformentioned range listed, please consider follow up with your Primary Care Provider.   ________________________________________________________  The  GI providers would like to encourage you to use MYCHART to communicate with providers for non-urgent requests or questions.  Due to long hold times on the telephone, sending your provider a message by [facility] may be a faster and more efficient way to get a response.  Please allow 48 business hours for a response.  Please remember that this is for non-urgent  requests.  _______________________________________________________  [facility] is using a team-based approach to care.  Your team is made up of your doctor and  two to three APPS. Our APPS (Nurse Practitioners and Physician Assistants) work with your physician to ensure care continuity for you. They are fully qualified to address your health concerns and develop a treatment plan. They communicate directly with your gastroenterologist to care for you. Seeing the Advanced Practice Practitioners on your physician's team can help you by facilitating care more promptly, often allowing for earlier appointments, access to diagnostic testing, procedures, and other specialty referrals.    Thank you for trusting me with your gastrointestinal care. Deanna May, NP-C

## 2024-02-23 NOTE — Progress Notes (Signed)
 Agree with the assessment and plan as outlined by [facility], FNP-C.  Carlitos Bottino, DO, [facility]

## 2024-02-24 ENCOUNTER — Encounter: Payer: Self-pay | Admitting: Adult Health

## 2024-02-24 ENCOUNTER — Ambulatory Visit: Admitting: Adult Health

## 2024-02-24 VITALS — BP 138/78 | HR 67 | Ht 63.75 in | Wt 226.0 lb

## 2024-02-24 DIAGNOSIS — G4733 Obstructive sleep apnea (adult) (pediatric): Secondary | ICD-10-CM | POA: Diagnosis not present

## 2024-02-24 DIAGNOSIS — H532 Diplopia: Secondary | ICD-10-CM

## 2024-02-24 NOTE — Patient Instructions (Signed)
 Your Plan:  Continue using CPAP therapy Make a sooner appointment with ophthalmology for double vision Please have note sent to our office If you have any additional dizzy events let us  know Certainly if you have any dizzy events/feeling off balance that does not resolve please seek care emergency care at your local ED or urgent care     Thank you for coming to see us  at [facility]. I hope we have been able to provide you high quality care today.  You may receive a patient satisfaction survey over the next few weeks. We would appreciate your feedback and comments so that we may continue to improve ourselves and the health of our patients.

## 2024-02-24 NOTE — Progress Notes (Signed)
 PATIENT: Brittany Padilla DOB: Dec 15, 1965  REASON FOR VISIT: follow up HISTORY FROM: patient PRIMARY NEUROLOGIST: Dr. Onita Sleep Neurologist: Dr. Buck  Chief Complaint  Patient presents with   RM 19    Patient is here alone for 1 yr cpap follow-up. She doesn't like it but she states she uses it well and she is resting better. She also started progesterone  and feels the combo helps. She was hoping that she would lose more weight and have more energy during the day but that hasn't happened. ESS 2      HISTORY OF PRESENT ILLNESS: Today 02/24/24:   Brittany GUNNOE is a 58 y.o. female with a history of OSA on CPAP and diplopia. Returns today for follow-up.  She reports that the CPAP is working well.  She does not particularly like using it and was hoping to lose weight.  She states that she still has diplopia and prisms were working well for her but she states here recently she feels like is gotten worse.  She does plan to see her ophthalmologist  Dr. Octavia sooner.  She has had an unremarkable neurologic workup with Dr. Jenel and Dr. Onita.  She states that she has had 1 episode of extreme dizziness where she felt like she was leaning over.  She states that she has not had any additional episodes like that.  She is not sure if it is related to her double vision. Last MRI brain was in 2022  She returns today for an evaluation.     02/22/23: Brittany Padilla is a 58 y.o. female with a history of OSA on CPAP. Returns today for follow-up.  She reports that she still struggles to use the CPAP.  She states that if she uses it consistently she will get nosebleeds.  She tends to wake up frequently when using the CPAP.  She states that when she wakes up she finds it hard to go back to sleep.  Her original sleep study was done when she was in [place] .  It showed mild sleep apnea.  She states that she is now on HRT and has slept well-without using the CPAP.  She returns today for an  evaluation.     08/20/21: Brittany Padilla is a 58 year old female with a history of obstructive sleep apnea on CPAP.  She reports that she has been having a hard time using the CPAP due to nosebleeds.  She also feels that her humidification is not working.  Her download indicates that she use her machine 20 out of the last 30 days for compliance of 66%.  On average she will use her machine 2 hours and 14 minutes.  Her residual AHI is 2.5 on 4 to 6 cm of water.  Advised patient that a mask refitting may be beneficial as well as having her DME company look at her CPAP machine.  Patient also initially saw Dr. Jenel and then Dr. Kristie for double vision.  Her work-up with us  has remained relatively unremarkable.  She has been seeing an optometrist but not an ophthalmologist.  Reports that since she got her latest COVID booster.  She does feel better but still has days that she has double vision.  Tends to notice it more when she is in the car or moving.  Mainly affects only the left eye.  Did a trial on Mestinon  with Dr. Jenel but did not notice any benefit.  Returns today for an evaluation.  HISTORY (copied from  Dr. Obie note)  Brittany Padilla is a 58 year old right-handed woman with an underlying medical history of allergies, reflux disease, hypothyroidism, migraines, diplopia (recently seen by my colleague, Dr. Jenel), irritable bowel syndrome, history of diverticulitis, and obesity, who was previously diagnosed with obstructive sleep apnea and was placed on PAP therapy.  Sleep testing was in March 2019.  I was able to review her home sleep test report from 09/30/2017.  Study was done in [place]  at the time.  Her overall AHI was 8/h, O2 nadir 87.7%.  She was started on AutoPap therapy.  She has a Sports administrator.  She has registered her machine back in June as she was notified of the recall on the [employer].  I was able to review a compliance report as well.  She recently had some weight  increase.  She is working on lifestyle modification, trying to exercise more.  She has been trying to get back on her CPAP machine.  It looks like she has been on an AutoPap machine, 90th percentile of pressure at 5.8 cm, average AHI 2.6, compliance data was reviewed from 01/18/2021 through 02/16/2021.  Average usage was 2 hours and 49 minutes, leak on the higher side. Her biggest issue is trying to stay asleep.  She has tried over-the-counter medication including Benadryl, melatonin about 5 mg, and other supplements, natural remedies.  Melatonin gave her headache.  She has had good success in reducing her double vision with prism  eyeglasses.  She goes to bed between 930 and 10 and rise time is generally between 5:15 AM and 5:45 AM.  She has woken up occasionally with a headache and has nocturia about once or twice per average night.  She is divorced and lives alone, 1 cat and 2 dogs in the household.  Sometimes her pets wake her up in the middle of the night, most of the time it is no apparent reason.  It is hard for her to maintain treatment with her AutoPap machine once she is up in the middle of the night.  She limits her caffeine to 2 cups of coffee in the morning, drinks alcohol  rarely, is a non-smoker.  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  She does not have a local DME company yet.  She has been using nasal pillows and has extra supplies.  Of note, she has been on sertraline , has been on an antidepressant off and on for years.  She is currently taking the sertraline  in the mornings.  REVIEW OF SYSTEMS: Out of a complete 14 system review of symptoms, the patient complains only of the following symptoms, and all other reviewed systems are negative.  ESS 2  ALLERGIES: Allergies  Allergen Reactions   Ansaid [Flurbiprofen]     Prefers not too   Ibuprofen    Latex Hives   Nsaids Hives and Other (See Comments)    Other reaction(s): Rash - Hives   Celecoxib Other (See Comments), Itching and  Rash    Red itchy hands and feet and roof of mouth itched Other reaction(s): Other (See Comments), Rash - Hives Red itchy hands and feet and roof of mouth itched    Sertraline  Hcl     Other reaction(s): Headaches    HOME MEDICATIONS: Outpatient Medications Prior to Visit  Medication Sig Dispense Refill   cetirizine (ZYRTEC) 10 MG tablet      cyclobenzaprine  (FLEXERIL ) 10 MG tablet Take 0.5-1 tablets (5-10 mg total) by mouth at bedtime as needed,  and as tolerated can increase to 3 times daily as needed. 20 tablet 0   Estradiol  0.75 MG/1.25 GM (0.06%) topical gel Apply 1 pump every day by topical route for 90 days. 112.5 g 3   famotidine (PEPCID) 40 MG/5ML suspension Take by mouth daily. (Patient taking differently: Take by mouth 2 (two) times daily.)     levocetirizine (XYZAL) 2.5 MG/5ML solution Take 2.5 mg by mouth every evening.     levothyroxine  (SYNTHROID ) 75 MCG tablet TAKE 1 TABLET BY MOUTH EVERY MORNING ON AN EMPTY STOMACH 90 tablet 3   levothyroxine  (SYNTHROID ) 75 MCG tablet Take 1 tablet (75 mcg total) by mouth in the morning on an empty stomach 90 tablet 3   Probiotic Product (PROBIOTIC PO) Take by mouth. It is in her fiber pill     progesterone  (PROMETRIUM ) 100 MG capsule Take 1 capsule (100 mg total) by mouth at bedtime. 90 capsule 3   psyllium (METAMUCIL) 58.6 % packet Take 1 packet by mouth daily.     sertraline  (ZOLOFT ) 50 MG tablet TAKE 1 TABLET BY MOUTH ONCE DAILY IF NOT TOLERATED CAN TAKE 1/2 TABLET FOR 2 WEEKS 173 tablet 0   sertraline  (ZOLOFT ) 50 MG tablet Take 1 tablet (50 mg total) by mouth daily. 90 tablet 4   triamterene -hydrochlorothiazide  (DYAZIDE ) 37.5-25 MG capsule Take 1 each (1 capsule total) by mouth daily as needed for edema 30 capsule 1   VITAMIN D PO Take by mouth daily. 10000 units     Estradiol  (DIVIGEL ) 0.5 MG/0.5GM GEL Place 0.5 mg onto the skin daily. (Patient not taking: Reported on 02/24/2024) 90 each 3   fexofenadine-pseudoephedrine (ALLEGRA-D 24)  180-240 MG 24 hr tablet Take 1 tablet by mouth daily. (Patient not taking: Reported on 02/24/2024)     influenza vac split quadrivalent PF (FLUARIX  QUADRIVALENT) 0.5 ML injection Inject into the muscle. 0.5 mL 0   mupirocin  ointment (BACTROBAN ) 2 % Apply to affected area(s) twice daily for 5 days (Patient not taking: Reported on 02/24/2024) 22 g 0   sucralfate  (CARAFATE ) 1 g tablet Take 1 tablet by mouth on an empty stomach 4 times daily (Patient not taking: Reported on 02/24/2024) 120 tablet 0   triamcinolone  cream (KENALOG ) 0.1 % Apply to affected area(s) three times daily (Patient not taking: Reported on 02/24/2024) 30 g 1   albuterol  (VENTOLIN  HFA) 108 (90 Base) MCG/ACT inhaler Inhale 2 puffs into the lungs every 4 (four) hours as needed for up to 10 days for wheezing or shortness of breath. 6.8 g 0   predniSONE  (DELTASONE ) 20 MG tablet Take 2 tablets (40 mg total) by mouth daily for 5 days (Patient not taking: Reported on 02/24/2024) 10 tablet 0   No facility-administered medications prior to visit.    PAST MEDICAL HISTORY: Past Medical History:  Diagnosis Date   Anxiety    Clostridium difficile infection 2008   Colon polyps    Depression    Diverticulosis    GERD (gastroesophageal reflux disease)    Irritable bowel syndrome (IBS)    Migraines    Obesity    PVC (premature ventricular contraction)    when stressed   Sleep apnea    mild    Thyroid  disease    2010 nodules only   Viral meningitis 1998    PAST SURGICAL HISTORY: Past Surgical History:  Procedure Laterality Date   APPENDECTOMY  1998   CHOLECYSTECTOMY  2000   COLONOSCOPY     ESOPHAGOGASTRODUODENOSCOPY      FAMILY  HISTORY: Family History  Problem Relation Age of Onset   Lung cancer Mother    Colonic polyp Mother    Sleep apnea Mother    Lung cancer Father    Breast cancer Sister    Sleep apnea Sister    Tremor Sister    Colon cancer Neg Hx    Esophageal cancer Neg Hx    Rectal cancer Neg Hx     SOCIAL  HISTORY: Social History   Socioeconomic History   Marital status: Divorced    Spouse name: Not on file   Number of children: 0   Years of education: Not on file   Highest education level: Not on file  Occupational History   Occupation: Ecologist  Tobacco Use   Smoking status: Never   Smokeless tobacco: Never  Vaping Use   Vaping status: Never Used  Substance and Sexual Activity   Alcohol  use: Yes    Comment: rare   Drug use: Never   Sexual activity: Not on file  Other Topics Concern   Not on file  Social History Narrative   Caffeine: 2 cups coffee/day, maybe 1 Dr Nunzio   Social Drivers of Health   Financial Resource Strain: Not on file  Food Insecurity: Not on file  Transportation Needs: Not on file  Physical Activity: Sufficiently Active (02/04/2024)   Received from [facility]   Exercise Vital Sign    On average, how many days per week do you engage in moderate to strenuous exercise (like a brisk walk)?: 5 days    On average, how many minutes do you engage in exercise at this level?: 30 min  Stress: Not on file  Social Connections: Not on file  Intimate Partner Violence: Not on file      PHYSICAL EXAM  Vitals:   02/24/24 1256  BP: 138/78  Pulse: 67  Weight: 226 lb (102.5 kg)  Height: 5' 3.75 (1.619 m)   Body mass index is 39.1 kg/m.  Generalized: Well developed, in no acute distress  Chest: Lungs clear to auscultation bilaterally  Neurological examination  Mentation: Alert oriented to time, place, history taking. Follows all commands speech and language fluent Cranial nerve II-XII: Extraocular movements were full, visual field were full on confrontational test Head turning and shoulder shrug  were normal and symmetric. Motor: The motor testing reveals 5 over 5 strength of all 4 extremities. Good symmetric motor tone is noted throughout.  Sensory: Sensory testing is intact to soft touch on all 4 extremities. No evidence of extinction  is noted.  Gait and station: Gait is normal.    DIAGNOSTIC DATA (LABS, IMAGING, TESTING) - I reviewed patient records, labs, notes, testing and imaging myself where available.   Lab Results  Component Value Date   HGBA1C 5.6 01/08/2021   Lab Results  Component Value Date   VITAMINB12 655 01/08/2021   Lab Results  Component Value Date   TSH 1.870 05/12/2021      ASSESSMENT AND PLAN 58 y.o. year old female  has a past medical history of Anxiety, Clostridium difficile infection (2008), Colon polyps, Depression, Diverticulosis, GERD (gastroesophageal reflux disease), Irritable bowel syndrome (IBS), Migraines, Obesity, PVC (premature ventricular contraction), Sleep apnea, Thyroid  disease, and Viral meningitis (1998). here with:  OSA on CPAP Diplopia  - CPAP compliance good - Good treatment of AHI  - Follow-up with ophthalmology in regards to double vision as prisms may have to be adjusted.  Did ask for her to let  us  know when she sees ophthalmology so we can review their note - If you have any additional dizzy events or new symptoms certainly let us  know. - Follow-up in 6 to 8 months or sooner if needed     Duwaine Russell, MSN, NP-C 02/24/2024, 1:07 PM [facility] [address] [place], [place] [address] [phone]  The patient's condition requires frequent monitoring and adjustments in the treatment plan, reflecting the ongoing complexity of care.  This provider is the continuing focal point for all needed services for this condition.

## 2024-02-25 ENCOUNTER — Encounter: Payer: Self-pay | Admitting: Adult Health

## 2024-02-25 ENCOUNTER — Other Ambulatory Visit (HOSPITAL_COMMUNITY)

## 2024-02-25 DIAGNOSIS — H532 Diplopia: Secondary | ICD-10-CM | POA: Diagnosis not present

## 2024-02-28 DIAGNOSIS — G4733 Obstructive sleep apnea (adult) (pediatric): Secondary | ICD-10-CM | POA: Diagnosis not present

## 2024-03-03 DIAGNOSIS — E039 Hypothyroidism, unspecified: Secondary | ICD-10-CM | POA: Diagnosis not present

## 2024-03-03 DIAGNOSIS — E559 Vitamin D deficiency, unspecified: Secondary | ICD-10-CM | POA: Diagnosis not present

## 2024-03-03 DIAGNOSIS — R7301 Impaired fasting glucose: Secondary | ICD-10-CM | POA: Diagnosis not present

## 2024-03-03 DIAGNOSIS — E041 Nontoxic single thyroid nodule: Secondary | ICD-10-CM | POA: Diagnosis not present

## 2024-03-03 LAB — HEMOGLOBIN A1C: A1c: 5.5

## 2024-03-03 LAB — LAB REPORT - SCANNED: EGFR: 103

## 2024-03-07 ENCOUNTER — Other Ambulatory Visit (HOSPITAL_BASED_OUTPATIENT_CLINIC_OR_DEPARTMENT_OTHER): Payer: Self-pay

## 2024-03-07 ENCOUNTER — Ambulatory Visit (HOSPITAL_COMMUNITY)

## 2024-03-07 MED ORDER — PREDNISONE 10 MG (21) PO TBPK
ORAL_TABLET | ORAL | 1 refills | Status: AC
  Filled 2024-03-07: qty 21, 6d supply, fill #0
  Filled 2024-04-07: qty 21, 6d supply, fill #1

## 2024-03-08 ENCOUNTER — Other Ambulatory Visit (HOSPITAL_BASED_OUTPATIENT_CLINIC_OR_DEPARTMENT_OTHER): Payer: Self-pay

## 2024-03-09 ENCOUNTER — Other Ambulatory Visit (HOSPITAL_BASED_OUTPATIENT_CLINIC_OR_DEPARTMENT_OTHER): Payer: Self-pay

## 2024-03-09 MED ORDER — ZEPBOUND 2.5 MG/0.5ML ~~LOC~~ SOAJ
2.5000 mg | SUBCUTANEOUS | 3 refills | Status: AC
  Filled 2024-03-09 – 2024-07-25 (×3): qty 2, 28d supply, fill #0
  Filled 2024-08-21: qty 2, 28d supply, fill #1

## 2024-03-10 ENCOUNTER — Other Ambulatory Visit (HOSPITAL_BASED_OUTPATIENT_CLINIC_OR_DEPARTMENT_OTHER): Payer: Self-pay

## 2024-03-13 ENCOUNTER — Other Ambulatory Visit (HOSPITAL_BASED_OUTPATIENT_CLINIC_OR_DEPARTMENT_OTHER): Payer: Self-pay

## 2024-03-13 DIAGNOSIS — H532 Diplopia: Secondary | ICD-10-CM | POA: Diagnosis not present

## 2024-03-17 ENCOUNTER — Other Ambulatory Visit (HOSPITAL_BASED_OUTPATIENT_CLINIC_OR_DEPARTMENT_OTHER): Payer: Self-pay

## 2024-03-21 ENCOUNTER — Other Ambulatory Visit: Payer: Self-pay | Admitting: Gastroenterology

## 2024-03-21 ENCOUNTER — Ambulatory Visit (HOSPITAL_COMMUNITY)
Admission: RE | Admit: 2024-03-21 | Discharge: 2024-03-21 | Disposition: A | Discharge status: Home / Self Care | Source: Ambulatory Visit | Attending: Gastroenterology | Admitting: Gastroenterology

## 2024-03-21 DIAGNOSIS — R1013 Epigastric pain: Secondary | ICD-10-CM | POA: Diagnosis not present

## 2024-03-21 DIAGNOSIS — R1310 Dysphagia, unspecified: Secondary | ICD-10-CM | POA: Diagnosis not present

## 2024-03-21 DIAGNOSIS — R1312 Dysphagia, oropharyngeal phase: Secondary | ICD-10-CM | POA: Diagnosis not present

## 2024-03-21 DIAGNOSIS — K449 Diaphragmatic hernia without obstruction or gangrene: Secondary | ICD-10-CM | POA: Diagnosis not present

## 2024-03-21 DIAGNOSIS — K581 Irritable bowel syndrome with constipation: Secondary | ICD-10-CM | POA: Insufficient documentation

## 2024-03-21 DIAGNOSIS — K219 Gastro-esophageal reflux disease without esophagitis: Secondary | ICD-10-CM | POA: Diagnosis not present

## 2024-03-21 DIAGNOSIS — K224 Dyskinesia of esophagus: Secondary | ICD-10-CM | POA: Diagnosis not present

## 2024-03-23 ENCOUNTER — Other Ambulatory Visit: Payer: Self-pay | Admitting: Gastroenterology

## 2024-03-23 ENCOUNTER — Telehealth: Payer: Self-pay | Admitting: Gastroenterology

## 2024-03-23 ENCOUNTER — Other Ambulatory Visit (HOSPITAL_BASED_OUTPATIENT_CLINIC_OR_DEPARTMENT_OTHER): Payer: Self-pay

## 2024-03-23 ENCOUNTER — Telehealth: Payer: Self-pay

## 2024-03-23 ENCOUNTER — Ambulatory Visit: Payer: Self-pay | Admitting: Gastroenterology

## 2024-03-23 DIAGNOSIS — K219 Gastro-esophageal reflux disease without esophagitis: Secondary | ICD-10-CM

## 2024-03-23 DIAGNOSIS — K449 Diaphragmatic hernia without obstruction or gangrene: Secondary | ICD-10-CM

## 2024-03-23 MED ORDER — VOQUEZNA 10 MG PO TABS
10.0000 mg | ORAL_TABLET | Freq: Every day | ORAL | 1 refills | Status: DC
  Filled 2024-03-23: qty 90, 90d supply, fill #0

## 2024-03-23 MED ORDER — VOQUEZNA 10 MG PO TABS: 10.0000 mg | ORAL_TABLET | Freq: Every day | ORAL | 1 refills | Status: AC

## 2024-03-23 NOTE — Telephone Encounter (Signed)
 Attempted to call patient on number on file to discuss Dr. Rennis recommendations with recent swallow study.  Did not get an answer left voicemail and will go ahead and send patient MyChart message

## 2024-03-23 NOTE — Progress Notes (Signed)
 Sent voquenza 10mg  po daily for GERD

## 2024-03-23 NOTE — Telephone Encounter (Signed)
 Referral to bariatric surgery has been sent.

## 2024-03-24 ENCOUNTER — Other Ambulatory Visit (HOSPITAL_BASED_OUTPATIENT_CLINIC_OR_DEPARTMENT_OTHER): Payer: Self-pay

## 2024-03-27 ENCOUNTER — Telehealth: Payer: Self-pay | Admitting: Gastroenterology

## 2024-03-27 DIAGNOSIS — H532 Diplopia: Secondary | ICD-10-CM | POA: Diagnosis not present

## 2024-03-27 NOTE — Telephone Encounter (Signed)
 Received a call from [facility] regarding medication order of VOQUEZNA  for patient, is requesting fu call with deanna may to discuss provider information and further details, [phone]. Please review and advise  Thank you

## 2024-03-28 NOTE — Telephone Encounter (Signed)
 Called [facility]. I spoke with technician, Kristen. They were needing Supervising MD information. I provided Dr. Rennis information. No other information was needed.

## 2024-03-31 ENCOUNTER — Other Ambulatory Visit (HOSPITAL_COMMUNITY): Payer: Self-pay

## 2024-04-03 ENCOUNTER — Telehealth: Payer: Self-pay

## 2024-04-03 ENCOUNTER — Other Ambulatory Visit (HOSPITAL_COMMUNITY): Payer: Self-pay

## 2024-04-03 NOTE — Telephone Encounter (Signed)
 Pharmacy Patient Advocate Encounter   Received notification from CoverMyMeds that prior authorization for Voquezna  10MG  tablets is required/requested.   Insurance verification completed.   The patient is insured through [facility] .   Per test claim: PA required; PA submitted to above mentioned insurance via Latent Key/confirmation #/EOC [id number] Status is pending

## 2024-04-04 ENCOUNTER — Other Ambulatory Visit (HOSPITAL_BASED_OUTPATIENT_CLINIC_OR_DEPARTMENT_OTHER): Payer: Self-pay

## 2024-04-04 NOTE — Telephone Encounter (Signed)
 Pharmacy Patient Advocate Encounter  Received notification from [facility] that Prior Authorization for Voquezna  10MG  tablets has been APPROVED from 04-03-2024 to 04-30-2024   PA #/Case ID/Reference #: [id number]

## 2024-04-07 ENCOUNTER — Other Ambulatory Visit (HOSPITAL_BASED_OUTPATIENT_CLINIC_OR_DEPARTMENT_OTHER): Payer: Self-pay

## 2024-04-21 DIAGNOSIS — K449 Diaphragmatic hernia without obstruction or gangrene: Secondary | ICD-10-CM | POA: Diagnosis not present

## 2024-04-21 DIAGNOSIS — R1319 Other dysphagia: Secondary | ICD-10-CM | POA: Diagnosis not present

## 2024-04-21 DIAGNOSIS — E66812 Obesity, class 2: Secondary | ICD-10-CM | POA: Diagnosis not present

## 2024-04-21 DIAGNOSIS — G4733 Obstructive sleep apnea (adult) (pediatric): Secondary | ICD-10-CM | POA: Diagnosis not present

## 2024-05-01 DIAGNOSIS — H5213 Myopia, bilateral: Secondary | ICD-10-CM | POA: Diagnosis not present

## 2024-05-12 ENCOUNTER — Other Ambulatory Visit (HOSPITAL_BASED_OUTPATIENT_CLINIC_OR_DEPARTMENT_OTHER): Payer: Self-pay

## 2024-05-12 MED ORDER — FLUZONE 0.5 ML IM SUSY
0.5000 mL | PREFILLED_SYRINGE | Freq: Once | INTRAMUSCULAR | 0 refills | Status: AC
  Filled 2024-05-12: qty 0.5, 1d supply, fill #0

## 2024-05-28 DIAGNOSIS — G4733 Obstructive sleep apnea (adult) (pediatric): Secondary | ICD-10-CM | POA: Diagnosis not present

## 2024-06-09 ENCOUNTER — Other Ambulatory Visit (HOSPITAL_BASED_OUTPATIENT_CLINIC_OR_DEPARTMENT_OTHER): Payer: Self-pay

## 2024-06-09 ENCOUNTER — Other Ambulatory Visit (HOSPITAL_COMMUNITY): Payer: Self-pay

## 2024-06-09 ENCOUNTER — Other Ambulatory Visit: Payer: Self-pay

## 2024-06-09 MED ORDER — SERTRALINE HCL 50 MG PO TABS
50.0000 mg | ORAL_TABLET | Freq: Every day | ORAL | 4 refills | Status: AC
  Filled 2024-06-09: qty 90, 90d supply, fill #0

## 2024-06-09 MED ORDER — LEVOTHYROXINE SODIUM 75 MCG PO TABS
75.0000 ug | ORAL_TABLET | Freq: Every morning | ORAL | 3 refills | Status: AC
  Filled 2024-06-09: qty 90, 90d supply, fill #0

## 2024-06-09 MED ORDER — COMIRNATY 30 MCG/0.3ML IM SUSY
0.3000 mL | PREFILLED_SYRINGE | Freq: Once | INTRAMUSCULAR | 0 refills | Status: AC
  Filled 2024-06-09: qty 0.3, 1d supply, fill #0

## 2024-06-12 ENCOUNTER — Encounter (HOSPITAL_COMMUNITY): Payer: Self-pay

## 2024-06-12 ENCOUNTER — Other Ambulatory Visit (HOSPITAL_BASED_OUTPATIENT_CLINIC_OR_DEPARTMENT_OTHER): Payer: Self-pay

## 2024-06-12 DIAGNOSIS — K449 Diaphragmatic hernia without obstruction or gangrene: Secondary | ICD-10-CM | POA: Diagnosis not present

## 2024-06-12 DIAGNOSIS — R1310 Dysphagia, unspecified: Secondary | ICD-10-CM | POA: Diagnosis not present

## 2024-06-13 ENCOUNTER — Other Ambulatory Visit (HOSPITAL_COMMUNITY): Payer: Self-pay

## 2024-06-14 ENCOUNTER — Other Ambulatory Visit (HOSPITAL_COMMUNITY): Payer: Self-pay

## 2024-06-23 ENCOUNTER — Other Ambulatory Visit (HOSPITAL_BASED_OUTPATIENT_CLINIC_OR_DEPARTMENT_OTHER): Payer: Self-pay

## 2024-07-03 ENCOUNTER — Other Ambulatory Visit (HOSPITAL_BASED_OUTPATIENT_CLINIC_OR_DEPARTMENT_OTHER): Payer: Self-pay

## 2024-07-03 ENCOUNTER — Other Ambulatory Visit (HOSPITAL_COMMUNITY): Payer: Self-pay

## 2024-07-05 ENCOUNTER — Other Ambulatory Visit (HOSPITAL_BASED_OUTPATIENT_CLINIC_OR_DEPARTMENT_OTHER): Payer: Self-pay

## 2024-07-06 ENCOUNTER — Other Ambulatory Visit (HOSPITAL_BASED_OUTPATIENT_CLINIC_OR_DEPARTMENT_OTHER): Payer: Self-pay

## 2024-07-25 ENCOUNTER — Other Ambulatory Visit (HOSPITAL_COMMUNITY): Payer: Self-pay

## 2024-07-25 ENCOUNTER — Other Ambulatory Visit: Payer: Self-pay

## 2024-07-25 ENCOUNTER — Other Ambulatory Visit (HOSPITAL_BASED_OUTPATIENT_CLINIC_OR_DEPARTMENT_OTHER): Payer: Self-pay

## 2024-07-26 ENCOUNTER — Other Ambulatory Visit: Payer: Self-pay

## 2024-07-26 ENCOUNTER — Encounter: Payer: Self-pay | Admitting: Pharmacist

## 2024-07-26 ENCOUNTER — Other Ambulatory Visit (HOSPITAL_COMMUNITY): Payer: Self-pay

## 2024-07-26 ENCOUNTER — Other Ambulatory Visit (HOSPITAL_BASED_OUTPATIENT_CLINIC_OR_DEPARTMENT_OTHER): Payer: Self-pay

## 2024-08-11 ENCOUNTER — Encounter: Admitting: Dietician

## 2024-08-21 ENCOUNTER — Other Ambulatory Visit (HOSPITAL_BASED_OUTPATIENT_CLINIC_OR_DEPARTMENT_OTHER): Payer: Self-pay

## 2024-08-22 ENCOUNTER — Other Ambulatory Visit (HOSPITAL_BASED_OUTPATIENT_CLINIC_OR_DEPARTMENT_OTHER): Payer: Self-pay

## 2024-08-31 ENCOUNTER — Encounter: Admitting: Dietician

## 2024-11-06 ENCOUNTER — Ambulatory Visit: Admitting: Adult Health
